# Patient Record
Sex: Female | Born: 1970
Health system: Southern US, Community
[De-identification: ages and names within clinical notes are randomized; demographics above are authoritative.]

## PROBLEM LIST (undated history)

## (undated) DIAGNOSIS — K52832 Lymphocytic colitis: Secondary | ICD-10-CM

## (undated) HISTORY — PX: WISDOM TOOTH EXTRACTION: SHX21

## (undated) HISTORY — PX: TUBAL LIGATION: SHX77

## (undated) HISTORY — PX: NOVASURE ABLATION: SHX5394

## (undated) HISTORY — PX: DILATION AND CURETTAGE OF UTERUS: SHX78

---

## 1998-04-07 ENCOUNTER — Other Ambulatory Visit: Admission: RE | Admit: 1998-04-07 | Discharge: 1998-04-07 | Payer: Self-pay | Admitting: Obstetrics & Gynecology

## 1998-05-07 ENCOUNTER — Other Ambulatory Visit: Admission: RE | Admit: 1998-05-07 | Discharge: 1998-05-07 | Payer: Self-pay | Admitting: Obstetrics & Gynecology

## 1999-04-15 ENCOUNTER — Other Ambulatory Visit: Admission: RE | Admit: 1999-04-15 | Discharge: 1999-04-15 | Payer: Self-pay | Admitting: Obstetrics & Gynecology

## 2000-05-11 ENCOUNTER — Other Ambulatory Visit: Admission: RE | Admit: 2000-05-11 | Discharge: 2000-05-11 | Payer: Self-pay | Admitting: Obstetrics & Gynecology

## 2000-09-11 ENCOUNTER — Ambulatory Visit (HOSPITAL_COMMUNITY): Admission: RE | Admit: 2000-09-11 | Discharge: 2000-09-11 | Payer: Self-pay | Admitting: Obstetrics & Gynecology

## 2000-09-11 ENCOUNTER — Encounter (INDEPENDENT_AMBULATORY_CARE_PROVIDER_SITE_OTHER): Payer: Self-pay | Admitting: Specialist

## 2001-07-12 ENCOUNTER — Other Ambulatory Visit: Admission: RE | Admit: 2001-07-12 | Discharge: 2001-07-12 | Payer: Self-pay | Admitting: Obstetrics & Gynecology

## 2002-04-01 ENCOUNTER — Encounter (INDEPENDENT_AMBULATORY_CARE_PROVIDER_SITE_OTHER): Payer: Self-pay | Admitting: Specialist

## 2002-04-01 ENCOUNTER — Inpatient Hospital Stay (HOSPITAL_COMMUNITY): Admission: AD | Admit: 2002-04-01 | Discharge: 2002-04-03 | Payer: Self-pay | Admitting: Obstetrics and Gynecology

## 2002-08-07 ENCOUNTER — Other Ambulatory Visit: Admission: RE | Admit: 2002-08-07 | Discharge: 2002-08-07 | Payer: Self-pay | Admitting: Obstetrics & Gynecology

## 2003-12-12 ENCOUNTER — Other Ambulatory Visit: Admission: RE | Admit: 2003-12-12 | Discharge: 2003-12-12 | Payer: Self-pay | Admitting: Obstetrics & Gynecology

## 2007-01-05 ENCOUNTER — Ambulatory Visit (HOSPITAL_COMMUNITY): Admission: RE | Admit: 2007-01-05 | Discharge: 2007-01-05 | Payer: Self-pay | Admitting: Family Medicine

## 2010-02-05 ENCOUNTER — Ambulatory Visit (HOSPITAL_COMMUNITY): Admission: RE | Admit: 2010-02-05 | Discharge: 2010-02-05 | Payer: Self-pay | Admitting: Obstetrics and Gynecology

## 2010-10-09 LAB — CBC
MCH: 33.6 pg (ref 26.0–34.0)
MCV: 98.7 fL (ref 78.0–100.0)
Platelets: 172 10*3/uL (ref 150–400)
RBC: 3.94 MIL/uL (ref 3.87–5.11)

## 2010-12-10 NOTE — Discharge Summary (Signed)
   NAME:  Andrea Daugherty, Andrea Daugherty                         ACCOUNT NO.:  [id number]   MEDICAL RECORD NO.:  [id number]                   PATIENT TYPE:  AMB   LOCATION:  SDC                                  FACILITY:  [facility]   PHYSICIAN:  Ilda Mori, M.D.                DATE OF BIRTH:  10/02/1970   DATE OF ADMISSION:  04/01/2002  DATE OF DISCHARGE:  04/03/2002                                 DISCHARGE SUMMARY   FINAL DIAGNOSES:  1. Intrauterine pregnancy at term.  2. History of previous cesarean section x2.  Declines attempt at vaginal     birth after cesarean.  3. The patient desires permanent sterilization.   PROCEDURE:  Repeat low transverse cesarean section and bilateral tubal  ligation.   SURGEON:  Malva Limes, MD   ASSISTANT:  Carrington Clamp, M.D.   COMPLICATIONS:  None.   HISTORY:  This 40 year old G4, P2-0-1-2 presents at term for repeat cesarean  section.  The patient had had a history of cesarean sections with her past  two pregnancies and declines VBAC.  The patient's antepartum course was  complicated by smoking and late prenatal care.  It was otherwise negative.  She was taken to the operating room on April 01, 2002 by Dr. Malva Limes where a repeat low transverse cesarean section was performed with  the delivery of an 8 pound 4 ounce female infant with Apgars of 8 and 9.  Delivery went without complications.  A bilateral tubal ligation was  performed at this time without complications.  The patient's postoperative  course was benign without significant fevers.  She was felt ready for  discharge on postoperative day number two.  She was sent home on a regular  diet.  Told to decrease activities.  Told to continue prenatal vitamins and  FeSo4.  She was sent home on over-the-counter pain medicine.  She declined  offer for Percocet and was to follow up in the office in four weeks.  Hemoglobin on discharge:  The patient had a hemoglobin of 9.4.     Leilani Able, PA-C                   Ilda Mori, M.D.    MB/MEDQ  D:  05/13/2002  T:  05/13/2002  Job:  [id number]

## 2010-12-10 NOTE — Op Note (Signed)
[facility]  Patient:    Andrea Daugherty, Andrea Daugherty                      MRN: [id number] Proc. Date: 09/11/00 Adm. Date:  [id number] Attending:  Mickle Mallory                           Operative Report  PREOPERATIVE DIAGNOSIS:       Inevitable abortion, blood type O positive.  POSTOPERATIVE DIAGNOSIS:      Inevitable abortion, blood type O positive, pathology pending.  PROCEDURE:                    Suction dilatation and curettage.  SURGEON:                      Gerrit Friends. Aldona Bar, M.D.  HISTORY:                      This 40 year old gravida 3, para 2 has been followed in the office for the past week with dropping quantitative hCG levels and vaginal spotting.  An ultrasound done today in the office found a sac consistent with a five week gestation - no fetal pole was noted.  By dates, the patient should be at least 6-7 weeks.  She is taken to the operating room at this time for suction D&C with the diagnosis of an inevitable AB.  DESCRIPTION OF PROCEDURE:     The patient was taken to the operating room where, after the satisfactory induction of intravenous conscious sedation, she was prepped and draped, having been placed in the modified lithotomy position in the short Allen stirrups.  The bladder was drained of clear urine with a red rubber catheter in in-and-out fashion.  After the patient was prepped and draped, a speculum was placed in the vagina and the cervix was grasped with a single tooth tenaculum on the anterior lip.  Paracervical block was then carried out with approximately 20 cc of 1% Xylocaine with epinephrine. Thereafter, using the [facility] dilators, the internal os was dilated to a [employer] without difficulty.  Thereafter, using a #7 suction curet, the cavity was thoroughly, gently and systematically evacuated of all products of conception.  This was followed by the small curet, which revealed no further tissue produced, and resuctioning  likewise produced to further tissue.  At this point, the procedure was terminated.  The patient was transported to the recovery room in satisfactory condition, having tolerated the procedure well. Estimated blood loss was neglibile.  All counts were correct x 2.  Pathologic specimen consistent of products of conception.  DISPOSITION:                  The patient will be discharged to home with a prescription for doxycycline 100 mg b.i.d. for a total of four days and she will be given a prescription for Anaprox DS to use one q.8h. p.r.n. cramping. She will return to the office in approximately one weeks time for follow up or as needed.  A detailed instruction sheet will be given to the patient at the time of discharge. DD:  09/12/00 TD:  09/12/00 Job: [id number]

## 2010-12-10 NOTE — Op Note (Signed)
NAME:  Andrea Daugherty, Andrea Daugherty                         ACCOUNT NO.:  [id number]   MEDICAL RECORD NO.:  [id number]                   PATIENT TYPE:  INP   LOCATION:  9198                                 FACILITY:  [facility]   PHYSICIAN:  Malva Limes, MD                   DATE OF BIRTH:  [birth year]   DATE OF PROCEDURE:  04/01/2002  DATE OF DISCHARGE:                                 OPERATIVE REPORT   PREOPERATIVE DIAGNOSES:  1. Intrauterine pregnancy at term.  2. History of cesarean sections times two.  3. Declines attempt at vaginal delivery after cesarean section.  4. The patient desires permanent sterilization.   POSTOPERATIVE DIAGNOSES:  1. Intrauterine pregnancy at term.  2. History of cesarean section times two.  3. Declines attempt at vaginal delivery after cesarean section.  4. The patient desires permanent sterilization.   PROCEDURE:  1. Repeat low transverse cesarean section.  2. Bilateral tubal ligation.   SURGEON:  Malva Limes, M.D.   ASSISTANT:  Carrington Clamp, M.D.   ANESTHESIA:  Spinal.   ANTIBIOTIC:  Ancef one gram.   ESTIMATED BLOOD LOSS:  900 cc.   DRAINS:  Foley to bedside drainage.   COMPLICATIONS:  None.   SPECIMENS:  None.   FINDINGS:  The patient had normal fallopian tubes and ovaries bilaterally.  Placenta appeared to be normal.  The patient delivered one viable white  female infant weighing 8 pounds 4 ounces.   PROCEDURE:  The patient was taken to the operating room where spinal  anesthetic was administered without complications. She was then placed in  the dorsal supine position with a left lateral tilt.  The patient was  prepped with Hibiclens. A Foley catheter was placed.  She was draped in the  usual fashion for this procedure.  A Pfannenstiel incision was made through  the previous scar and this was carried down to the fascia.  The fascia was  entered in the midline and extended laterally with the Mayo scissors. The  rectus muscles were then  separate from the fascia with Bovie.  The rectus  muscles were divided in the midline and taken down superiorly and  inferiorly.  There was an omental adhesion which was taken down with the  Bovie.  The bladder flap was then taken down sharply.  A low transverse  uterine incision was made in the midline and extended laterally with blunt  dissection.  The amniotic sac was entered sharply and the fluid was noted to  be clear.  The infant was discovered to be in a frank breech presentation.  The infant was delivered by delivering the feet first, then the arms and  then the head.  The oropharynx and nostrils were then bulb suctioned. The  cord was doubled clamped and cut and the infant handed to the waiting NICU  team.  Cord blood was then obtained.  The placenta was then  manually  removed.  The uterus was externalized and the uterine cavity washed with a  wet lap.  The placenta was rather adherent to the uterine wall making this  suspicious for placenta accreta.  Once I was sure that the placenta was  removed the uterine incision was closed using 0 chromic in a running locking  fashion. The bladder flap was closed using 3-0 chromic in a running fashion.  The right fallopian tube was grasped with the Babcock in the isthmic  portion. A 3 cm knuckle was ligated times two with 0 gut and then excised.  The ostia were visualized times two.  Hemostasis was checked and felt to be  adequate.  A similar procedure was performed on the opposite side.  The  uterus was then placed back into the abdominal cavity.  Hemostasis and  sutures were checked and felt to be normal.  The parietal peritoneum and  rectus muscles were reapproximated in the midline using 3-0 chromic suture.  The fascia was closed using 0 Monocryl suture. Clips were used to close the  skin. The patient tolerated the procedure well and she was taken to the  recovery room in stable condition.  Instrument and lap counts were correct  times  two.                                               Malva Limes, MD    MA/MEDQ  D:  04/01/2002  T:  04/01/2002  Job:  [id number]

## 2011-08-06 ENCOUNTER — Emergency Department (HOSPITAL_COMMUNITY): Payer: BC Managed Care – PPO

## 2011-08-06 ENCOUNTER — Emergency Department (HOSPITAL_COMMUNITY)
Admission: EM | Admit: 2011-08-06 | Discharge: 2011-08-06 | Disposition: A | Discharge status: Home / Self Care | Payer: BC Managed Care – PPO | Attending: Emergency Medicine | Admitting: Emergency Medicine

## 2011-08-06 ENCOUNTER — Encounter (HOSPITAL_COMMUNITY): Payer: Self-pay | Admitting: Emergency Medicine

## 2011-08-06 DIAGNOSIS — M1990 Unspecified osteoarthritis, unspecified site: Secondary | ICD-10-CM

## 2011-08-06 DIAGNOSIS — M25519 Pain in unspecified shoulder: Secondary | ICD-10-CM | POA: Insufficient documentation

## 2011-08-06 DIAGNOSIS — M25511 Pain in right shoulder: Secondary | ICD-10-CM

## 2011-08-06 DIAGNOSIS — M19019 Primary osteoarthritis, unspecified shoulder: Secondary | ICD-10-CM | POA: Insufficient documentation

## 2011-08-06 NOTE — ED Notes (Signed)
Patient c/o right shoulder pain. Per patient rotator cuff has been "rubbing for a while but yesterday she felt a pop and the pain has been worse since.

## 2011-08-06 NOTE — ED Notes (Signed)
Pt reports has had problems with r shoulder "grinding" for "a while."  Pt says this am raised arms over head to stretch and felt something pop.  Pt able to move extremity.  Extremity warm to touch, radial pulse present.

## 2011-08-06 NOTE — ED Provider Notes (Signed)
Medical screening examination/treatment/procedure(s) were performed by non-physician practitioner and as supervising physician I was immediately available for consultation/collaboration.  Donnetta Hutching, MD 08/06/11 [phone]

## 2011-08-06 NOTE — ED Provider Notes (Signed)
History     CSN: [id number]  Arrival date & time 08/06/11  0945   First MD Initiated Contact with Patient 08/06/11 1051      Chief Complaint  Patient presents with  . Shoulder Pain    (Consider location/radiation/quality/duration/timing/severity/associated sxs/prior treatment) Patient is a 41 y.o. female presenting with shoulder pain. The history is provided by the patient. No language interpreter was used.  Shoulder Pain This is a chronic problem. Episode onset: popping and grinding for several months.  worsening pain in past 3 weeks. The problem occurs daily. The problem has been unchanged. Exacerbated by: shoulder movement. She has tried nothing for the symptoms. The treatment provided no relief.    History reviewed. No pertinent past medical history.  Past Surgical History  Procedure Date  . Cesarean section   . Dilation and curettage of uterus   . Wisdom tooth extraction   . Novasure ablation   . Tubal ligation     Family History  Problem Relation Age of Onset  . Cancer Mother   . Heart failure Father     History  Substance Use Topics  . Smoking status: Current Everyday Smoker -- 1.0 packs/day for 28 years    Types: Cigarettes  . Smokeless tobacco: Never Used  . Alcohol Use: No    OB History    Grav Para Term Preterm Abortions TAB SAB Ect Mult Living   4 3 3  1  1   3       Review of Systems  Musculoskeletal:       Shoulder pain  All other systems reviewed and are negative.    Allergies  Hydrocodone  Home Medications  No current outpatient prescriptions on file.  BP 113/79  Pulse 68  Temp(Src) 98.3 F (36.8 C) (Oral)  Resp 16  Ht 5\' 1"  (1.549 m)  Wt 122 lb (55.339 kg)  BMI 23.05 kg/m2  SpO2 99%  LMP 08/02/2011  Physical Exam  Nursing note and vitals reviewed. Constitutional: She is oriented to person, place, and time. She appears well-developed and well-nourished. No distress.  HENT:  Head: Normocephalic and atraumatic.  Eyes: EOM  are normal.  Neck: Normal range of motion.  Cardiovascular: Normal rate, regular rhythm and normal heart sounds.   Pulmonary/Chest: Effort normal and breath sounds normal.  Abdominal: Soft. She exhibits no distension. There is no tenderness.  Musculoskeletal: Normal range of motion. She exhibits tenderness.       Right shoulder: She exhibits tenderness, bony tenderness, crepitus and pain. She exhibits normal range of motion, no swelling, no effusion, no deformity, no laceration, no spasm, normal pulse and normal strength.       Arms: Neurological: She is alert and oriented to person, place, and time.  Skin: Skin is warm and dry.  Psychiatric: She has a normal mood and affect. Judgment normal.    ED Course  Procedures (including critical care time)  Labs Reviewed - No data to display No results found.   No diagnosis found.    MDM          Worthy Rancher, PA 08/06/11 1141

## 2011-08-16 ENCOUNTER — Encounter: Payer: Self-pay | Admitting: Orthopedic Surgery

## 2011-08-16 ENCOUNTER — Ambulatory Visit (INDEPENDENT_AMBULATORY_CARE_PROVIDER_SITE_OTHER): Payer: BC Managed Care – PPO | Admitting: Orthopedic Surgery

## 2011-08-16 VITALS — BP 100/70 | Ht 61.0 in | Wt 122.0 lb

## 2011-08-16 DIAGNOSIS — M75101 Unspecified rotator cuff tear or rupture of right shoulder, not specified as traumatic: Secondary | ICD-10-CM

## 2011-08-16 DIAGNOSIS — M25519 Pain in unspecified shoulder: Secondary | ICD-10-CM

## 2011-08-16 DIAGNOSIS — M67919 Unspecified disorder of synovium and tendon, unspecified shoulder: Secondary | ICD-10-CM

## 2011-08-16 NOTE — Patient Instructions (Addendum)
You have received a steroid shot. 15% of patients experience increased pain at the injection site with in the next 24 hours. This is best treated with ice and tylenol extra strength 2 tabs every 8 hours. If you are still having pain please call the office.    Bursitis shoulder

## 2011-08-17 ENCOUNTER — Encounter: Payer: Self-pay | Admitting: Orthopedic Surgery

## 2011-08-17 DIAGNOSIS — M25519 Pain in unspecified shoulder: Secondary | ICD-10-CM | POA: Insufficient documentation

## 2011-08-17 DIAGNOSIS — M75101 Unspecified rotator cuff tear or rupture of right shoulder, not specified as traumatic: Secondary | ICD-10-CM | POA: Insufficient documentation

## 2011-08-17 NOTE — Progress Notes (Signed)
Patient ID: Andrea Daugherty, female   DOB: 1971-06-27, 41 y.o.   MRN: [id number] RIGHT shoulder pain started August 05, 2011  41 year old right-hand-dominant female presents with sudden onset of pain in the RIGHT shoulder.  She was at work her shoulder pop since that time she's had throbbing for the 10 intermittent pain associated with some tingling in the RIGHT upper extremity and some difficulty with range of motion.  She has taken some ibuprofen seems to help only minimally.  Review of Systems  Respiratory: Positive for cough.   Neurological: Positive for tingling.  Endo/Heme/Allergies: Positive for environmental allergies.  All other systems reviewed and are negative.  Physical Exam(12) GENERAL: normal development   CDV: pulses are normal   Skin: normal  Lymph: nodes were not palpable/normal  Psychiatric: awake, alert and oriented  Neuro: normal sensation  MSK Ambulation is normal, cervical spine range of motion is normal, no tenderness. 1 RIGHT shoulder full range of motion with crepitance especially internal and external rotation of the RIGHT shoulder.  It is painful forward elevation past 120.  Internal and external rotation strength remains normal supraspinatus strength remains normal.  Apprehension test is normal.  Impingement sign is positive.  Assessment: X-ray was done at the hospital it shows a normal shoulder with mild a.c. Joint arthrosis.  Impression rotator cuff syndrome RIGHT shoulder  Shoulder Injection Procedure Note   Pre-operative Diagnosis: right  RC Syndrome  Post-operative Diagnosis: same  Indications: pain   Anesthesia: ethyl chloride   Procedure Details   Verbal consent was obtained for the procedure. The shoulder was prepped withalcohol and the skin was anesthetized. A 20 gauge needle was advanced into the subacromial space through posterior approach without difficulty  The space was then injected with 3 ml 1% lidocaine and 1 ml of depomedrol.  The injection site was cleansed with isopropyl alcohol and a dressing was applied.  Complications:  None; patient tolerated the procedure well.      Plan: Inject RIGHT shoulder, take ibuprofen 800 mg 3 times a day.  Take Vicodin every 6 hours p.r.n. Pain #30.  Followup as needed

## 2012-02-27 ENCOUNTER — Other Ambulatory Visit: Payer: Self-pay | Admitting: Obstetrics & Gynecology

## 2013-07-11 ENCOUNTER — Ambulatory Visit (INDEPENDENT_AMBULATORY_CARE_PROVIDER_SITE_OTHER): Payer: BC Managed Care – PPO

## 2013-07-11 ENCOUNTER — Ambulatory Visit (INDEPENDENT_AMBULATORY_CARE_PROVIDER_SITE_OTHER): Payer: BC Managed Care – PPO | Admitting: Orthopedic Surgery

## 2013-07-11 VITALS — BP 117/79 | Ht 61.5 in | Wt 144.0 lb

## 2013-07-11 DIAGNOSIS — M25521 Pain in right elbow: Secondary | ICD-10-CM

## 2013-07-11 DIAGNOSIS — M7711 Lateral epicondylitis, right elbow: Secondary | ICD-10-CM

## 2013-07-11 DIAGNOSIS — M25529 Pain in unspecified elbow: Secondary | ICD-10-CM

## 2013-07-11 DIAGNOSIS — M7710 Lateral epicondylitis, unspecified elbow: Secondary | ICD-10-CM | POA: Insufficient documentation

## 2013-07-11 NOTE — Progress Notes (Signed)
Patient ID: Andrea Daugherty, female   DOB: [birth year], 42 y.o.   MRN: [id number]  NEW PROBLEM   PAIN RIGHT ELBOW    42 year old female previously seen for bursitis right shoulder presents with sudden onset of right elbow pain along the lateral epicondyles. Present for 3 weeks. Sharp pain is noted with 4/10 intensity, tends to come and go worse with lifting better with not using her arm mild swelling  She is a right-hand-dominant farmer  Currently review of systems positive findings her cough heartburn diarrhea joint pain and swelling elbow muscle pain elbow seasonal allergies  Hydrocodone gives her a rash surgery includes cesarean section x31 D&C wisdom teeth extraction  Current medications Advil for right shoulder area family history normal  Social history married one pack per day smoker no alcohol   habitus Vital signs are stable as recorded  General appearance is normal, body habitus Ectomorphic body   The patient is alert and oriented x 3  The patient's mood and affect are normal  Gait assessment: normal and noncontributory   The cardiovascular exam reveals normal pulses and temperature without edema or  swelling.  The lymphatic system is negative for palpable lymph nodes  The sensory exam is normal.  There are no pathologic reflexes.  Balance is normal.   Exam of the right elbow  Inspection lateral tenderness over the epicondyles and slightly anterior and distal Range of motion normal Stability normal Strength grade 5 motor strength  Skin normal, no rash, or laceration. Provocative tests positive  X-rays are normal AP and lateral x-ray was taken  Lateral epicondylitis  Injection Tennis elbow brace Ice Advil  Lateral epicondyle injection. (right)  Consent.  Timeout to confirm site.  LEFT elbow was injected with Depo-Medrol 40 mg and lidocaine 1% 3 cc with sterile technique using alcohol and ethyl chloride prep.  There were no complications

## 2013-07-11 NOTE — Patient Instructions (Signed)
You have received a steroid shot. 15% of patients experience increased pain at the injection site with in the next 24 hours. This is best treated with ice and tylenol extra strength 2 tabs every 8 hours. If you are still having pain please call the office.    

## 2013-07-22 ENCOUNTER — Other Ambulatory Visit: Payer: Self-pay | Admitting: Obstetrics & Gynecology

## 2013-11-08 ENCOUNTER — Other Ambulatory Visit: Payer: Self-pay | Admitting: Gastroenterology

## 2014-05-26 ENCOUNTER — Encounter: Payer: Self-pay | Admitting: Orthopedic Surgery

## 2014-07-28 ENCOUNTER — Other Ambulatory Visit: Payer: Self-pay | Admitting: Obstetrics & Gynecology

## 2014-07-29 LAB — CYTOLOGY - PAP

## 2015-05-21 ENCOUNTER — Ambulatory Visit (INDEPENDENT_AMBULATORY_CARE_PROVIDER_SITE_OTHER): Payer: BLUE CROSS/BLUE SHIELD | Admitting: Orthopedic Surgery

## 2015-05-21 ENCOUNTER — Encounter: Payer: Self-pay | Admitting: Orthopedic Surgery

## 2015-05-21 ENCOUNTER — Ambulatory Visit (INDEPENDENT_AMBULATORY_CARE_PROVIDER_SITE_OTHER): Payer: BLUE CROSS/BLUE SHIELD

## 2015-05-21 VITALS — BP 115/71 | Ht 61.5 in | Wt 155.0 lb

## 2015-05-21 DIAGNOSIS — M7541 Impingement syndrome of right shoulder: Secondary | ICD-10-CM | POA: Diagnosis not present

## 2015-05-21 DIAGNOSIS — M25511 Pain in right shoulder: Secondary | ICD-10-CM | POA: Diagnosis not present

## 2015-05-21 MED ORDER — HYDROCODONE-ACETAMINOPHEN 5-325 MG PO TABS: 1.0000 | ORAL_TABLET | Freq: Four times a day (QID) | ORAL | Status: DC | PRN

## 2015-05-21 NOTE — Patient Instructions (Signed)
Joint Injection  Care After  Refer to this sheet in the next few days. These instructions provide you with information on caring for yourself after you have had a joint injection. Your caregiver also may give you more specific instructions. Your treatment has been planned according to current medical practices, but problems sometimes occur. Call your caregiver if you have any problems or questions after your procedure.  After any type of joint injection, it is not uncommon to experience:  · Soreness, swelling, or bruising around the injection site.  · Mild numbness, tingling, or weakness around the injection site caused by the numbing medicine used before or with the injection.  It also is possible to experience the following effects associated with the specific agent after injection:  · Iodine-based contrast agents:  ¨ Allergic reaction (itching, hives, widespread redness, and swelling beyond the injection site).  · Corticosteroids (These effects are rare.):  ¨ Allergic reaction.  ¨ Increased blood sugar levels (If you have diabetes and you notice that your blood sugar levels have increased, notify your caregiver).  ¨ Increased blood pressure levels.  ¨ Mood swings.  · Hyaluronic acid in the use of viscosupplementation.  ¨ Temporary heat or redness.  ¨ Temporary rash and itching.  ¨ Increased fluid accumulation in the injected joint.  These effects all should resolve within a day after your procedure.   HOME CARE INSTRUCTIONS  · Limit yourself to light activity the day of your procedure. Avoid lifting heavy objects, bending, stooping, or twisting.  · Take prescription or over-the-counter pain medication as directed by your caregiver.  · You may apply ice to your injection site to reduce pain and swelling the day of your procedure. Ice may be applied 03-04 times:  ¨ Put ice in a plastic bag.  ¨ Place a towel between your skin and the bag.  ¨ Leave the ice on for no longer than 15-20 minutes each time.  SEEK  IMMEDIATE MEDICAL CARE IF:   · Pain and swelling get worse rather than better or extend beyond the injection site.  · Numbness does not go away.  · Blood or fluid continues to leak from the injection site.  · You have chest pain.  · You have swelling of your face or tongue.  · You have trouble breathing or you become dizzy.  · You develop a fever, chills, or severe tenderness at the injection site that last longer than 1 day.  MAKE SURE YOU:  · Understand these instructions.  · Watch your condition.  · Get help right away if you are not doing well or if you get worse.  Document Released: 03/24/2011 Document Revised: 10/03/2011 Document Reviewed: 03/24/2011  [employer]® Patient Information ©2015 [employer]. This information is not intended to replace advice given to you by your health care provider. Make sure you discuss any questions you have with your health care provider.

## 2015-05-21 NOTE — Progress Notes (Signed)
Patient ID: Andrea Daugherty, female   DOB: 08/09/1970, 44 y.o.   MRN: [id number]  Chief Complaint  Patient presents with  . Shoulder Pain    recurring right shoulder pain, last treated 2013     Andrea RueSharon C Cleaves is a 44 y.o. , femalewho now presents with right shoulder pain  Presents back with recurrent right shoulder pain previously treated with injection, ibuprofen and Norco 5 mg. She had impingement syndrome x-rays were negative back in 2013  She presents after an ER visit where she had no injury but she's been doing some work on the trailer of her daughter and now complains of dull aching anterior shoulder pain moderate to severe several weeks duration associated with forward elevation and overactivity  Review of Systems Review of Systems She denies fever chills, other joint pain other than the right knee, skin rash, warmth of the joint     Past Surgical History  Procedure Laterality Date  . Cesarean section    . Dilation and curettage of uterus    . Wisdom tooth extraction    . Novasure ablation    . Tubal ligation      Family History  Problem Relation Age of Onset  . Cancer Mother   . Heart failure Father     Social History Social History  Substance Use Topics  . Smoking status: Current Every Day Smoker -- 1.00 packs/day for 28 years    Types: Cigarettes  . Smokeless tobacco: Never Used  . Alcohol Use: No    Allergies  Allergen Reactions  . Hydrocodone Rash    Current Outpatient Prescriptions  Medication Sig Dispense Refill  . Ibuprofen 200 MG CAPS Take by mouth.    Marland Kitchen. HYDROcodone-acetaminophen (NORCO/VICODIN) 5-325 MG tablet Take 1 tablet by mouth every 6 (six) hours as needed for moderate pain. 30 tablet 0   No current facility-administered medications for this visit.       Physical Exam Blood pressure 115/71, height 5' 1.5" (1.562 m), weight 155 lb (70.308 kg), last menstrual period 05/07/2015. Physical Exam  Objective:     The patient is awake  alert and oriented 3 her mood and affect are normal. She exhibits normal grooming and hygiene without gross deformity.  Gait is normal the noncontributory  On inspection of the right shoulder we find tenderness in the rotator interval. The patient exhibits decreased range of motion with forward elevation in the scapular plane. We also find a loss of internal rotation  The patient is stable in abduction external rotation  The internal and external rotators have normal strength we find mild weakness in the right rotator cuff supraspinatus tendon with the empty can sign  The Neer sign for impingement was positive  The skin is warm dry and intact without erythema laceration or previous incision.  Sensation remains normal and the patient has a normal pulse with good perfusion and a warm extremity to touch  Cervical spine is nontender with full range of motion  Comparison left shoulder examination reveals a normal range of motion normal strength no instability and no swelling    Data Reviewed My interpretation of the xrays: Normal right shoulder films  Assessment Tendinitis with impingement syndrome right shoulder   Plan Injection Ibuprofen Norco   Procedure note the subacromial injection shoulder RIGHT  Verbal consent was obtained to inject the  RIGHT   Shoulder  Timeout was completed to confirm the injection site is a subacromial space of the  RIGHT  shoulder  Medication used Depo-Medrol 40 mg and lidocaine 1% 3 cc  Anesthesia was provided by ethyl chloride  The injection was performed in the RIGHT  posterior subacromial space. After pinning the skin with alcohol and anesthetized the skin with ethyl chloride the subacromial space was injected using a 20-gauge needle. There were no complications  Sterile dressing was applied.   Meds ordered this encounter  Medications  . HYDROcodone-acetaminophen (NORCO/VICODIN) 5-325 MG tablet    Sig: Take 1 tablet by mouth every 6  (six) hours as needed for moderate pain.    Dispense:  30 tablet    Refill:  0   Ibuprofen 800 mg 3 times a day

## 2016-05-13 DIAGNOSIS — H10413 Chronic giant papillary conjunctivitis, bilateral: Secondary | ICD-10-CM | POA: Diagnosis not present

## 2016-05-16 DIAGNOSIS — Z1389 Encounter for screening for other disorder: Secondary | ICD-10-CM | POA: Diagnosis not present

## 2016-05-16 DIAGNOSIS — J069 Acute upper respiratory infection, unspecified: Secondary | ICD-10-CM | POA: Diagnosis not present

## 2016-05-16 DIAGNOSIS — Z6826 Body mass index (BMI) 26.0-26.9, adult: Secondary | ICD-10-CM | POA: Diagnosis not present

## 2016-05-16 DIAGNOSIS — J209 Acute bronchitis, unspecified: Secondary | ICD-10-CM | POA: Diagnosis not present

## 2016-11-28 ENCOUNTER — Other Ambulatory Visit: Payer: Self-pay | Admitting: Obstetrics & Gynecology

## 2016-11-28 DIAGNOSIS — Z0000 Encounter for general adult medical examination without abnormal findings: Secondary | ICD-10-CM | POA: Diagnosis not present

## 2016-11-28 DIAGNOSIS — Z6824 Body mass index (BMI) 24.0-24.9, adult: Secondary | ICD-10-CM | POA: Diagnosis not present

## 2016-11-28 DIAGNOSIS — Z01419 Encounter for gynecological examination (general) (routine) without abnormal findings: Secondary | ICD-10-CM | POA: Diagnosis not present

## 2016-11-28 DIAGNOSIS — Z124 Encounter for screening for malignant neoplasm of cervix: Secondary | ICD-10-CM | POA: Diagnosis not present

## 2016-11-28 DIAGNOSIS — Z1231 Encounter for screening mammogram for malignant neoplasm of breast: Secondary | ICD-10-CM | POA: Diagnosis not present

## 2016-11-29 LAB — CYTOLOGY - PAP

## 2017-09-27 ENCOUNTER — Ambulatory Visit (HOSPITAL_COMMUNITY)
Admission: RE | Admit: 2017-09-27 | Discharge: 2017-09-27 | Disposition: A | Discharge status: Home / Self Care | Payer: BLUE CROSS/BLUE SHIELD | Source: Ambulatory Visit | Attending: Family Medicine | Admitting: Family Medicine

## 2017-09-27 ENCOUNTER — Other Ambulatory Visit (HOSPITAL_COMMUNITY): Payer: Self-pay | Admitting: Family Medicine

## 2017-09-27 DIAGNOSIS — J029 Acute pharyngitis, unspecified: Secondary | ICD-10-CM | POA: Diagnosis not present

## 2017-09-27 DIAGNOSIS — R1031 Right lower quadrant pain: Secondary | ICD-10-CM

## 2017-09-27 DIAGNOSIS — J111 Influenza due to unidentified influenza virus with other respiratory manifestations: Secondary | ICD-10-CM | POA: Diagnosis not present

## 2017-09-27 DIAGNOSIS — Z1389 Encounter for screening for other disorder: Secondary | ICD-10-CM | POA: Diagnosis not present

## 2017-09-27 DIAGNOSIS — E663 Overweight: Secondary | ICD-10-CM | POA: Diagnosis not present

## 2017-09-27 DIAGNOSIS — Z6825 Body mass index (BMI) 25.0-25.9, adult: Secondary | ICD-10-CM | POA: Diagnosis not present

## 2017-09-27 DIAGNOSIS — R630 Anorexia: Secondary | ICD-10-CM | POA: Diagnosis not present

## 2018-09-19 DIAGNOSIS — Z23 Encounter for immunization: Secondary | ICD-10-CM | POA: Diagnosis not present

## 2018-09-19 DIAGNOSIS — L089 Local infection of the skin and subcutaneous tissue, unspecified: Secondary | ICD-10-CM | POA: Diagnosis not present

## 2018-09-19 DIAGNOSIS — Z6824 Body mass index (BMI) 24.0-24.9, adult: Secondary | ICD-10-CM | POA: Diagnosis not present

## 2018-09-19 DIAGNOSIS — L309 Dermatitis, unspecified: Secondary | ICD-10-CM | POA: Diagnosis not present

## 2018-09-19 DIAGNOSIS — Z1389 Encounter for screening for other disorder: Secondary | ICD-10-CM | POA: Diagnosis not present

## 2018-09-26 DIAGNOSIS — Z6824 Body mass index (BMI) 24.0-24.9, adult: Secondary | ICD-10-CM | POA: Diagnosis not present

## 2018-09-26 DIAGNOSIS — L309 Dermatitis, unspecified: Secondary | ICD-10-CM | POA: Diagnosis not present

## 2018-09-26 DIAGNOSIS — L089 Local infection of the skin and subcutaneous tissue, unspecified: Secondary | ICD-10-CM | POA: Diagnosis not present

## 2018-09-26 DIAGNOSIS — Z1389 Encounter for screening for other disorder: Secondary | ICD-10-CM | POA: Diagnosis not present

## 2018-09-26 DIAGNOSIS — L989 Disorder of the skin and subcutaneous tissue, unspecified: Secondary | ICD-10-CM | POA: Diagnosis not present

## 2019-06-26 ENCOUNTER — Other Ambulatory Visit: Payer: Self-pay

## 2019-06-26 DIAGNOSIS — Z20822 Contact with and (suspected) exposure to covid-19: Secondary | ICD-10-CM

## 2019-06-28 LAB — NOVEL CORONAVIRUS, NAA: SARS-CoV-2, NAA: NOT DETECTED

## 2019-07-24 ENCOUNTER — Other Ambulatory Visit: Payer: Self-pay

## 2019-07-24 ENCOUNTER — Ambulatory Visit: Payer: BC Managed Care – PPO | Attending: Internal Medicine

## 2019-07-24 DIAGNOSIS — Z20828 Contact with and (suspected) exposure to other viral communicable diseases: Secondary | ICD-10-CM | POA: Diagnosis not present

## 2019-07-24 DIAGNOSIS — Z20822 Contact with and (suspected) exposure to covid-19: Secondary | ICD-10-CM

## 2019-07-25 LAB — NOVEL CORONAVIRUS, NAA: SARS-CoV-2, NAA: NOT DETECTED

## 2019-08-19 ENCOUNTER — Ambulatory Visit: Payer: BC Managed Care – PPO | Attending: Internal Medicine

## 2019-08-19 ENCOUNTER — Other Ambulatory Visit: Payer: Self-pay

## 2019-08-19 DIAGNOSIS — Z20822 Contact with and (suspected) exposure to covid-19: Secondary | ICD-10-CM | POA: Diagnosis not present

## 2019-08-20 DIAGNOSIS — J22 Unspecified acute lower respiratory infection: Secondary | ICD-10-CM | POA: Diagnosis not present

## 2019-08-20 DIAGNOSIS — Z681 Body mass index (BMI) 19 or less, adult: Secondary | ICD-10-CM | POA: Diagnosis not present

## 2019-08-20 DIAGNOSIS — J209 Acute bronchitis, unspecified: Secondary | ICD-10-CM | POA: Diagnosis not present

## 2019-08-20 LAB — NOVEL CORONAVIRUS, NAA: SARS-CoV-2, NAA: NOT DETECTED

## 2020-03-05 ENCOUNTER — Other Ambulatory Visit: Payer: BC Managed Care – PPO

## 2020-03-05 ENCOUNTER — Other Ambulatory Visit: Payer: Self-pay | Admitting: Radiology

## 2020-03-05 DIAGNOSIS — Z20822 Contact with and (suspected) exposure to covid-19: Secondary | ICD-10-CM

## 2020-03-06 LAB — NOVEL CORONAVIRUS, NAA: SARS-CoV-2, NAA: NOT DETECTED

## 2020-03-06 LAB — SARS-COV-2, NAA 2 DAY TAT

## 2020-06-11 ENCOUNTER — Ambulatory Visit
Admission: EM | Admit: 2020-06-11 | Discharge: 2020-06-11 | Disposition: A | Discharge status: Home / Self Care | Payer: 59 | Attending: Emergency Medicine | Admitting: Emergency Medicine

## 2020-06-11 ENCOUNTER — Other Ambulatory Visit: Payer: Self-pay

## 2020-06-11 DIAGNOSIS — R059 Cough, unspecified: Secondary | ICD-10-CM | POA: Diagnosis not present

## 2020-06-11 DIAGNOSIS — J441 Chronic obstructive pulmonary disease with (acute) exacerbation: Secondary | ICD-10-CM

## 2020-06-11 DIAGNOSIS — Z1152 Encounter for screening for COVID-19: Secondary | ICD-10-CM

## 2020-06-11 MED ORDER — DEXAMETHASONE SODIUM PHOSPHATE 10 MG/ML IJ SOLN
10.0000 mg | Freq: Once | INTRAMUSCULAR | Status: AC
  Administered 2020-06-11: 10 mg via INTRAMUSCULAR

## 2020-06-11 MED ORDER — PREDNISONE 10 MG (21) PO TBPK: ORAL_TABLET | Freq: Every day | ORAL | 0 refills | Status: DC

## 2020-06-11 MED ORDER — BENZONATATE 100 MG PO CAPS
100.0000 mg | ORAL_CAPSULE | Freq: Three times a day (TID) | ORAL | 0 refills | Status: DC
Start: 2020-06-11 — End: 2020-06-15

## 2020-06-11 MED ORDER — AZITHROMYCIN 250 MG PO TABS: 250.0000 mg | ORAL_TABLET | Freq: Every day | ORAL | 0 refills | Status: DC

## 2020-06-11 NOTE — ED Provider Notes (Signed)
[facility]   [id number] 06/11/20 Arrival Time: 0813   CC: Cough  SUBJECTIVE: History from: patient.  Andrea Daugherty is a 49 y.o. female who presents with productive cough and wheezing x 1 day.  Denies sick exposure to COVID, flu or strep.  Has tried OTC medications without relief.  Symptoms are made worse at night.  Reports previous symptoms in the past with bronchitis.  Report smoking history.   Denies fever, chills, sinus pain, rhinorrhea, sore throat, SOB, chest pain, nausea, changes in bowel or bladder habits.    ROS: As per HPI.  All other pertinent ROS negative.     History reviewed. No pertinent past medical history. Past Surgical History:  Procedure Laterality Date  . CESAREAN SECTION    . DILATION AND CURETTAGE OF UTERUS    . NOVASURE ABLATION    . TUBAL LIGATION    . WISDOM TOOTH EXTRACTION     Allergies  Allergen Reactions  . Hydrocodone Rash   No current facility-administered medications on file prior to encounter.   Current Outpatient Medications on File Prior to Encounter  Medication Sig Dispense Refill  . Ibuprofen 200 MG CAPS Take by mouth.     Social History   Socioeconomic History  . Marital status: Married    Spouse name: Not on file  . Number of children: Not on file  . Years of education: 102  . Highest education level: Not on file  Occupational History    Employer: [employer]  Tobacco Use  . Smoking status: Current Every Day Smoker    Packs/day: 1.00    Years: 28.00    Pack years: 28.00    Types: Cigarettes  . Smokeless tobacco: Never Used  Substance and Sexual Activity  . Alcohol use: No  . Drug use: No  . Sexual activity: Yes    Birth control/protection: Surgical  Other Topics Concern  . Not on file  Social History Narrative  . Not on file   Social Determinants of Health   Financial Resource Strain:   . Difficulty of Paying Living Expenses: Not on file  Food Insecurity:   . Worried About Brewing technologist in the Last Year: Not on file  . Ran Out of Food in the Last Year: Not on file  Transportation Needs:   . Lack of Transportation (Medical): Not on file  . Lack of Transportation (Non-Medical): Not on file  Physical Activity:   . Days of Exercise per Week: Not on file  . Minutes of Exercise per Session: Not on file  Stress:   . Feeling of Stress : Not on file  Social Connections:   . Frequency of Communication with Friends and Family: Not on file  . Frequency of Social Gatherings with Friends and Family: Not on file  . Attends Religious Services: Not on file  . Active Member of Clubs or Organizations: Not on file  . Attends Banker Meetings: Not on file  . Marital Status: Not on file  Intimate Partner Violence:   . Fear of Current or Ex-Partner: Not on file  . Emotionally Abused: Not on file  . Physically Abused: Not on file  . Sexually Abused: Not on file   Family History  Problem Relation Age of Onset  . Cancer Mother   . Heart failure Father     OBJECTIVE:  Vitals:   06/11/20 0832  BP: 137/90  Pulse: 80  Resp: 20  Temp: 98.7 F (37.1  C)  SpO2: 96%     General appearance: alert; appears fatigued, but nontoxic; speaking in full sentences and tolerating own secretions HEENT: NCAT; Ears: EACs clear, TMs pearly gray; Eyes: PERRL.  EOM grossly intact. Nose: nares patent without rhinorrhea, Throat: oropharynx clear, tonsils non erythematous or enlarged, uvula midline  Neck: supple without LAD Lungs: unlabored respirations, symmetrical air entry; cough: mild; no respiratory distress; mild subtle wheezes throughout Heart: regular rate and rhythm.   Skin: warm and dry Psychological: alert and cooperative; normal mood and affect  ASSESSMENT & PLAN:  1. Encounter for screening for COVID-19   2. Cough     Meds ordered this encounter  Medications  . benzonatate (TESSALON) 100 MG capsule    Sig: Take 1 capsule (100 mg total) by mouth every 8 (eight)  hours.    Dispense:  21 capsule    Refill:  0    Order Specific Question:   Supervising Provider    Answer:   Eustace Moore [[id number]]  . azithromycin (ZITHROMAX) 250 MG tablet    Sig: Take 1 tablet (250 mg total) by mouth daily. Take first 2 tablets together, then 1 every day until finished.    Dispense:  6 tablet    Refill:  0    Order Specific Question:   Supervising Provider    Answer:   Eustace Moore [[id number]]  . predniSONE (STERAPRED UNI-PAK 21 TAB) 10 MG (21) TBPK tablet    Sig: Take by mouth daily. Take 6 tabs by mouth daily  for 2 days, then 5 tabs for 2 days, then 4 tabs for 2 days, then 3 tabs for 2 days, 2 tabs for 2 days, then 1 tab by mouth daily for 2 days    Dispense:  42 tablet    Refill:  0    Order Specific Question:   Supervising Provider    Answer:   Eustace Moore [[id number]]  . dexamethasone (DECADRON) injection 10 mg    COVID testing ordered.  It will take between 5-7 days for test results.  Someone will contact you regarding abnormal results.    In the meantime: You should remain isolated in your home for 10 days from symptom onset AND greater than 72 hours after symptoms resolution (absence of fever without the use of fever-reducing medication and improvement in respiratory symptoms), whichever is longer Get plenty of rest and push fluids Tessalon Perles prescribed for cough Use OTC zyrtec for nasal congestion, runny nose, and/or sore throat Use OTC flonase for nasal congestion and runny nose Use medications daily for symptom relief Use OTC medications like ibuprofen or tylenol as needed fever or pain Call or go to the ED if you have any new or worsening symptoms such as fever, worsening cough, shortness of breath, chest tightness, chest pain, turning blue, changes in mental status, etc...   Patient hx significant for smoking and on exam subtle wheezes heard throughout.  Patient also reporting productive cough.  Will cover for COPD exacerbation.   Prednisone and z-pak prescribed.  Patient also requests steroid shot.    Reviewed expectations re: course of current medical issues. Questions answered. Outlined signs and symptoms indicating need for more acute intervention. Patient verbalized understanding. After Visit Summary given.         Rennis Harding, PA-C 06/11/20 [id number]

## 2020-06-11 NOTE — Discharge Instructions (Signed)

## 2020-06-11 NOTE — ED Triage Notes (Signed)
Pt presents with cough that began yesterday

## 2020-06-12 LAB — NOVEL CORONAVIRUS, NAA: SARS-CoV-2, NAA: NOT DETECTED

## 2020-06-12 LAB — SARS-COV-2, NAA 2 DAY TAT

## 2020-06-15 ENCOUNTER — Other Ambulatory Visit: Payer: Self-pay

## 2020-06-15 ENCOUNTER — Ambulatory Visit
Admission: EM | Admit: 2020-06-15 | Discharge: 2020-06-15 | Disposition: A | Discharge status: Home / Self Care | Payer: 59 | Attending: Emergency Medicine | Admitting: Emergency Medicine

## 2020-06-15 DIAGNOSIS — R059 Cough, unspecified: Secondary | ICD-10-CM

## 2020-06-15 DIAGNOSIS — R062 Wheezing: Secondary | ICD-10-CM

## 2020-06-15 MED ORDER — HYDROCOD POLST-CPM POLST ER 10-8 MG/5ML PO SUER: 5.0000 mL | Freq: Two times a day (BID) | ORAL | 0 refills | Status: DC | PRN

## 2020-06-15 MED ORDER — METHYLPREDNISOLONE SODIUM SUCC 125 MG IJ SOLR
125.0000 mg | Freq: Once | INTRAMUSCULAR | Status: AC
  Administered 2020-06-15: 125 mg via INTRAMUSCULAR

## 2020-06-15 MED ORDER — ALBUTEROL SULFATE HFA 108 (90 BASE) MCG/ACT IN AERS
2.0000 | INHALATION_SPRAY | Freq: Once | RESPIRATORY_TRACT | Status: AC
  Administered 2020-06-15: 2 via RESPIRATORY_TRACT

## 2020-06-15 NOTE — Discharge Instructions (Signed)
Due to worsening symptoms offered patient further evaluation and management in the ED.  Unable to do breathing treatment in urgent care setting.  Unable to rule out blood clot in urgent care setting.  Offered patient further evaluation and management in the ED.  Patient declines at this time and would like to try outpatient therapy first.  Aware of the risk associated with this decision including missed diagnosis, organ damage, organ failure, and/or death.  Patient aware and in agreement.     Get plenty of rest and push fluids Solu-medrol injection given in office Albuterol inhaler given in office tussinex cough syrup given Continue with prednisone taper Follow up with PCP for recheck Return or go to ER if you have any new or worsening symptoms such as fever, chills, fatigue, shortness of breath, wheezing, chest pain, nausea, changes in bowel or bladder habits, etc..Marland Kitchen

## 2020-06-15 NOTE — ED Provider Notes (Signed)
[facility]   [id number] 06/15/20 Arrival Time: 1143  Cc: COUGH  SUBJECTIVE:  Andrea Daugherty is a 49 y.o. female who presents with persistent dry cough x 5 days.  Denies sick exposure or precipitating event.  Describes cough as constant and dry.  Was seen here on 06/11/20 and given steroid shot, prednisone taper, and antibiotic with minimal relief.  Symptoms are made worse with deep breath and activity.  Denies previous symptoms in the past. Complains of associated throat irritation.  Denies fever, chills, SOB, wheezing, chest pain, nausea, changes in bowel or bladder habits.    ROS: As per HPI.  All other pertinent ROS negative.     History reviewed. No pertinent past medical history. Past Surgical History:  Procedure Laterality Date  . CESAREAN SECTION    . DILATION AND CURETTAGE OF UTERUS    . NOVASURE ABLATION    . TUBAL LIGATION    . WISDOM TOOTH EXTRACTION     Allergies  Allergen Reactions  . Hydrocodone Rash   No current facility-administered medications on file prior to encounter.   Current Outpatient Medications on File Prior to Encounter  Medication Sig Dispense Refill  . Ibuprofen 200 MG CAPS Take by mouth.    . predniSONE (STERAPRED UNI-PAK 21 TAB) 10 MG (21) TBPK tablet Take by mouth daily. Take 6 tabs by mouth daily  for 2 days, then 5 tabs for 2 days, then 4 tabs for 2 days, then 3 tabs for 2 days, 2 tabs for 2 days, then 1 tab by mouth daily for 2 days 42 tablet 0    Social History   Socioeconomic History  . Marital status: Married    Spouse name: Not on file  . Number of children: Not on file  . Years of education: 50  . Highest education level: Not on file  Occupational History    Employer: [employer]  Tobacco Use  . Smoking status: Current Every Day Smoker    Packs/day: 1.00    Years: 28.00    Pack years: 28.00    Types: Cigarettes  . Smokeless tobacco: Never Used  Substance and Sexual Activity  . Alcohol use: No  . Drug  use: No  . Sexual activity: Yes    Birth control/protection: Surgical  Other Topics Concern  . Not on file  Social History Narrative  . Not on file   Social Determinants of Health   Financial Resource Strain:   . Difficulty of Paying Living Expenses: Not on file  Food Insecurity:   . Worried About Programme researcher, broadcasting/film/video in the Last Year: Not on file  . Ran Out of Food in the Last Year: Not on file  Transportation Needs:   . Lack of Transportation (Medical): Not on file  . Lack of Transportation (Non-Medical): Not on file  Physical Activity:   . Days of Exercise per Week: Not on file  . Minutes of Exercise per Session: Not on file  Stress:   . Feeling of Stress : Not on file  Social Connections:   . Frequency of Communication with Friends and Family: Not on file  . Frequency of Social Gatherings with Friends and Family: Not on file  . Attends Religious Services: Not on file  . Active Member of Clubs or Organizations: Not on file  . Attends Banker Meetings: Not on file  . Marital Status: Not on file  Intimate Partner Violence:   . Fear of Current or Ex-Partner:  Not on file  . Emotionally Abused: Not on file  . Physically Abused: Not on file  . Sexually Abused: Not on file   Family History  Problem Relation Age of Onset  . Cancer Mother   . Heart failure Father      OBJECTIVE:  Vitals:   06/15/20 1209  BP: (!) 144/92  Pulse: 77  Resp: 20  Temp: 98.1 F (36.7 C)  SpO2: 97%     General appearance: Alert, appears mildly fatigued, but nontoxic; speaking in full sentences without difficulty HEENT:NCAT; Ears: EACs clear, TMs pearly gray; Eyes: PERRL.  EOM grossly intact. Nose: nares patent without rhinorrhea; Throat: tonsils nonerythematous or enlarged, uvula midline  Neck: supple without LAD Lungs: harsh wheezes and rhonchi heard throughout bilateral lungs; normal respiratory effort; moderate cough present Heart: regular rate and rhythm.   Skin: warm and  dry Psychological: alert and cooperative; normal mood and affect   ASSESSMENT & PLAN:  1. Cough   2. Wheezing     Meds ordered this encounter  Medications  . chlorpheniramine-HYDROcodone (TUSSIONEX PENNKINETIC ER) 10-8 MG/5ML SUER    Sig: Take 5 mLs by mouth every 12 (twelve) hours as needed for cough.    Dispense:  50 mL    Refill:  0    Order Specific Question:   Supervising Provider    Answer:   Eustace Moore [[id number]]  . albuterol (VENTOLIN HFA) 108 (90 Base) MCG/ACT inhaler 2 puff  . methylPREDNISolone sodium succinate (SOLU-MEDROL) 125 mg/2 mL injection 125 mg    No orders of the defined types were placed in this encounter.  Due to worsening symptoms offered patient further evaluation and management in the ED.  Unable to do breathing treatment in urgent care setting.  Unable to rule out blood clot in urgent care setting.  Offered patient further evaluation and management in the ED.  Patient declines at this time and would like to try outpatient therapy first.  Aware of the risk associated with this decision including missed diagnosis, organ damage, organ failure, and/or death.  Patient aware and in agreement.     Get plenty of rest and push fluids Solu-medrol injection given in office Albuterol inhaler given in office tussinex cough syrup given Continue with prednisone taper Follow up with PCP for recheck Return or go to ER if you have any new or worsening symptoms such as fever, chills, fatigue, shortness of breath, wheezing, chest pain, nausea, changes in bowel or bladder habits, etc...  Reviewed expectations re: course of current medical issues. Questions answered. Outlined signs and symptoms indicating need for more acute intervention. Patient verbalized understanding. After Visit Summary given.          Rennis Harding, PA-C 06/15/20 1255

## 2020-06-15 NOTE — ED Triage Notes (Signed)
Pt presents with worsening cough after completing antibiotic, tesslon gave adverse reaction according to patient it worsened her cough

## 2020-07-06 ENCOUNTER — Other Ambulatory Visit: Payer: Self-pay

## 2020-07-06 ENCOUNTER — Ambulatory Visit
Admission: EM | Admit: 2020-07-06 | Discharge: 2020-07-06 | Disposition: A | Discharge status: Home / Self Care | Payer: 59 | Attending: Emergency Medicine | Admitting: Emergency Medicine

## 2020-07-06 ENCOUNTER — Ambulatory Visit (INDEPENDENT_AMBULATORY_CARE_PROVIDER_SITE_OTHER): Payer: 59

## 2020-07-06 DIAGNOSIS — R079 Chest pain, unspecified: Secondary | ICD-10-CM

## 2020-07-06 DIAGNOSIS — R059 Cough, unspecified: Secondary | ICD-10-CM

## 2020-07-06 DIAGNOSIS — Z720 Tobacco use: Secondary | ICD-10-CM | POA: Diagnosis not present

## 2020-07-06 DIAGNOSIS — J208 Acute bronchitis due to other specified organisms: Secondary | ICD-10-CM | POA: Diagnosis not present

## 2020-07-06 DIAGNOSIS — R531 Weakness: Secondary | ICD-10-CM | POA: Diagnosis not present

## 2020-07-06 MED ORDER — AZITHROMYCIN 250 MG PO TABS: 250.0000 mg | ORAL_TABLET | Freq: Every day | ORAL | 0 refills | Status: DC

## 2020-07-06 NOTE — ED Triage Notes (Signed)
Pt presents with c/o continuing cough that hasn't been improved with prednisone

## 2020-07-06 NOTE — ED Provider Notes (Addendum)
[facility]   [id number] 07/06/20 Arrival Time: 1043   Chief Complaint  Patient presents with  . Cough    SUBJECTIVE: History from: patient.  Andrea Daugherty is a 49 y.o. female who presented to the urgent care for complaint of chronic cough with green sputum for the past few days and recurrent chest wall tenderness for the past 5 days.  Denies sick exposure to COVID, flu or strep.  Denies recent travel.  Has tried steroid without relief.  Symptoms are made worse with movement.  Denies previous symptoms in the past.   Denies fever, chills, fatigue, sinus pain, rhinorrhea, sore throat, SOB, wheezing, chest pain, nausea, changes in bowel or bladder habits.     ROS: As per HPI.  All other pertinent ROS negative.     History reviewed. No pertinent past medical history. Past Surgical History:  Procedure Laterality Date  . CESAREAN SECTION    . DILATION AND CURETTAGE OF UTERUS    . NOVASURE ABLATION    . TUBAL LIGATION    . WISDOM TOOTH EXTRACTION     Allergies  Allergen Reactions  . Hydrocodone Rash   No current facility-administered medications on file prior to encounter.   Current Outpatient Medications on File Prior to Encounter  Medication Sig Dispense Refill  . chlorpheniramine-HYDROcodone (TUSSIONEX PENNKINETIC ER) 10-8 MG/5ML SUER Take 5 mLs by mouth every 12 (twelve) hours as needed for cough. 50 mL 0  . Ibuprofen 200 MG CAPS Take by mouth.    . predniSONE (STERAPRED UNI-PAK 21 TAB) 10 MG (21) TBPK tablet Take by mouth daily. Take 6 tabs by mouth daily  for 2 days, then 5 tabs for 2 days, then 4 tabs for 2 days, then 3 tabs for 2 days, 2 tabs for 2 days, then 1 tab by mouth daily for 2 days 42 tablet 0   Social History   Socioeconomic History  . Marital status: Married    Spouse name: Not on file  . Number of children: Not on file  . Years of education: 22  . Highest education level: Not on file  Occupational History    Employer: [employer]   Tobacco Use  . Smoking status: Current Every Day Smoker    Packs/day: 1.00    Years: 28.00    Pack years: 28.00    Types: Cigarettes  . Smokeless tobacco: Never Used  Substance and Sexual Activity  . Alcohol use: No  . Drug use: No  . Sexual activity: Yes    Birth control/protection: Surgical  Other Topics Concern  . Not on file  Social History Narrative  . Not on file   Social Determinants of Health   Financial Resource Strain: Not on file  Food Insecurity: Not on file  Transportation Needs: Not on file  Physical Activity: Not on file  Stress: Not on file  Social Connections: Not on file  Intimate Partner Violence: Not on file   Family History  Problem Relation Age of Onset  . Cancer Mother   . Heart failure Father     OBJECTIVE:  Vitals:   07/06/20 1116  BP: (!) 142/91  Pulse: 74  Resp: 20  Temp: 98 F (36.7 C)  SpO2: 96%     General appearance: alert; appears fatigued, but nontoxic; speaking in full sentences and tolerating own secretions HEENT: NCAT; Ears: EACs clear, TMs pearly gray; Eyes: PERRL.  EOM grossly intact. Sinuses: nontender; Nose: nares patent without rhinorrhea, Throat: oropharynx clear, tonsils  non erythematous or enlarged, uvula midline  Neck: supple without LAD Lungs: unlabored respirations, symmetrical air entry; cough: moderate; no respiratory distress; CTAB Heart: regular rate and rhythm.  Radial pulses 2+ symmetrical bilaterally Skin: warm and dry Psychological: alert and cooperative; normal mood and affect Musc: Chest wall tenderness present on palpation LABS:  No results found for this or any previous visit (from the past 24 hour(s)).   RADIOLOGY:  DG Chest 2 View  Result Date: 07/06/2020 CLINICAL DATA:  Chest pain for 11 months. Recent cough. Weakness. Current smoker. EXAM: CHEST - 2 VIEW COMPARISON:  None. FINDINGS: The heart size and mediastinal contours are within normal limits. Both lungs are clear. The visualized  skeletal structures are unremarkable. IMPRESSION: No active cardiopulmonary disease. Electronically Signed   By: Signa Kell M.D.   On: 07/06/2020 11:35    Chest X-ray is negative for cardiopulmonary disease. I have reviewed the x-ray myself and the radiologist interpretation.  I am in agreement with the radiologist interpretation.   ASSESSMENT & PLAN:  1. Acute bronchitis due to other specified organisms     Meds ordered this encounter  Medications  . azithromycin (ZITHROMAX) 250 MG tablet    Sig: Take 1 tablet (250 mg total) by mouth daily. Take first 2 tablets together, then 1 every day until finished.    Dispense:  6 tablet    Refill:  0   Patient is stable at discharge.  Azithromycin was prescribed.  Declined prednisone.  Presented to continue to use ProAir as prescribed Discharge Instructions    Discharge instructions  Azithromycin was prescribed Continue to use ProAir as prescribed She was advised to follow-up with PCP for further evaluation and possible referral for a CT scan Use medications daily for symptom relief Use OTC medications like ibuprofen or tylenol as needed fever or pain Call or go to the ED if you have any new or worsening symptoms such as fever, worsening cough, shortness of breath, chest tightness, chest pain, turning blue, changes in mental status, etc...   Reviewed expectations re: course of current medical issues. Questions answered. Outlined signs and symptoms indicating need for more acute intervention. Patient verbalized understanding. After Visit Summary given.         Durward Parcel, FNP 07/06/20 1217    Durward Parcel, FNP 07/06/20 1220

## 2020-07-06 NOTE — Discharge Instructions (Addendum)
°  Azithromycin was prescribed Continue to use ProAir as prescribed She was advised to follow-up with PCP for further evaluation and possible referral for a CT scan Use medications daily for symptom relief Use OTC medications like ibuprofen or tylenol as needed fever or pain Call or go to the ED if you have any new or worsening symptoms such as fever, worsening cough, shortness of breath, chest tightness, chest pain, turning blue, changes in mental status, etc

## 2020-07-12 ENCOUNTER — Encounter: Payer: Self-pay | Admitting: Emergency Medicine

## 2020-07-12 ENCOUNTER — Other Ambulatory Visit: Payer: Self-pay

## 2020-07-12 ENCOUNTER — Ambulatory Visit
Admission: EM | Admit: 2020-07-12 | Discharge: 2020-07-12 | Disposition: A | Discharge status: Home / Self Care | Payer: 59 | Attending: Emergency Medicine | Admitting: Emergency Medicine

## 2020-07-12 DIAGNOSIS — R053 Chronic cough: Secondary | ICD-10-CM

## 2020-07-12 DIAGNOSIS — Z1152 Encounter for screening for COVID-19: Secondary | ICD-10-CM

## 2020-07-12 MED ORDER — DEXAMETHASONE SODIUM PHOSPHATE 10 MG/ML IJ SOLN
10.0000 mg | Freq: Once | INTRAMUSCULAR | Status: AC
  Administered 2020-07-12: 10 mg via INTRAMUSCULAR

## 2020-07-12 MED ORDER — HYDROCOD POLST-CPM POLST ER 10-8 MG/5ML PO SUER: 5.0000 mL | Freq: Two times a day (BID) | ORAL | 0 refills | Status: DC | PRN

## 2020-07-12 MED ORDER — PREDNISONE 10 MG (21) PO TBPK: ORAL_TABLET | Freq: Every day | ORAL | 0 refills | Status: DC

## 2020-07-12 NOTE — Discharge Instructions (Signed)
COVID testing ordered.  It will take between 2-7 days for test results.  Someone will contact you regarding abnormal results.    In the meantime: You should remain isolated in your home for 10 days from symptom onset AND greater than 24 hours after symptoms resolution (absence of fever without the use of fever-reducing medication and improvement in respiratory symptoms), whichever is longer Get plenty of rest and push fluids Tussinex prescribed for cough Decadron IM was given in office  Prednisone was prescribed/take as directed Use medications daily for symptom relief Use OTC medications like ibuprofen or tylenol as needed fever or pain Call or go to the ED if you have any new or worsening symptoms such as fever, worsening cough, shortness of breath, chest tightness, chest pain, turning blue, changes in mental status, etc..Marland Kitchen

## 2020-07-12 NOTE — ED Triage Notes (Signed)
Patient states that she had exposure to covid at work. - still having bronchitis symptoms.

## 2020-07-12 NOTE — ED Provider Notes (Addendum)
[facility]   [id number] 07/12/20 Arrival Time: 0830   CC: URI SUBJECTIVE: History from: patient and family.  Andrea Daugherty is a 49 y.o. female who presented to the urgent care with a complaint of chronic cough and Covid exposure at work the past few days.  Was previously seen at the urgent care on 07/06/2020.  Has tried Zithromax with no symptom improvement.  Denies sick exposure to  flu or strep.  Denies recent travel.  Denies of aggravating factors.  Denies previous symptoms in the past.   Denies fever, chills, fatigue, sinus pain, rhinorrhea, sore throat, SOB, wheezing, chest pain, nausea, changes in bowel or bladder habits.      ROS: As per HPI.  All other pertinent ROS negative.     History reviewed. No pertinent past medical history. Past Surgical History:  Procedure Laterality Date   CESAREAN SECTION     DILATION AND CURETTAGE OF UTERUS     NOVASURE ABLATION     TUBAL LIGATION     WISDOM TOOTH EXTRACTION     Allergies  Allergen Reactions   Hydrocodone Rash   No current facility-administered medications on file prior to encounter.   Current Outpatient Medications on File Prior to Encounter  Medication Sig Dispense Refill   azithromycin (ZITHROMAX) 250 MG tablet Take 1 tablet (250 mg total) by mouth daily. Take first 2 tablets together, then 1 every day until finished. 6 tablet 0   Ibuprofen 200 MG CAPS Take by mouth.     Social History   Socioeconomic History   Marital status: Married    Spouse name: Not on file   Number of children: Not on file   Years of education: 12   Highest education level: Not on file  Occupational History    Employer: [employer]  Tobacco Use   Smoking status: Current Every Day Smoker    Packs/day: 1.00    Years: 28.00    Pack years: 28.00    Types: Cigarettes   Smokeless tobacco: Never Used  Substance and Sexual Activity   Alcohol use: No   Drug use: No   Sexual activity: Yes    Birth  control/protection: Surgical  Other Topics Concern   Not on file  Social History Narrative   Not on file   Social Determinants of Health   Financial Resource Strain: Not on file  Food Insecurity: Not on file  Transportation Needs: Not on file  Physical Activity: Not on file  Stress: Not on file  Social Connections: Not on file  Intimate Partner Violence: Not on file   Family History  Problem Relation Age of Onset   Cancer Mother    Heart failure Father     OBJECTIVE:  Vitals:   07/12/20 0840  BP: (!) 157/102  Pulse: 78  Resp: 16  Temp: 98 F (36.7 C)  SpO2: 98%     General appearance: alert; appears fatigued, but nontoxic; speaking in full sentences and tolerating own secretions HEENT: NCAT; Ears: EACs clear, TMs pearly gray; Eyes: PERRL.  EOM grossly intact. Sinuses: nontender; Nose: nares patent without rhinorrhea, Throat: oropharynx clear, tonsils non erythematous or enlarged, uvula midline  Neck: supple without LAD Lungs: unlabored respirations, symmetrical air entry; cough: moderate; no respiratory distress; CTAB Heart: regular rate and rhythm.  Radial pulses 2+ symmetrical bilaterally Skin: warm and dry Psychological: alert and cooperative; normal mood and affect  LABS:  No results found for this or any previous visit (from the past  24 hour(s)).   ASSESSMENT & PLAN:  1. Encounter for screening for COVID-19   2. Chronic cough     Meds ordered this encounter  Medications   dexamethasone (DECADRON) injection 10 mg   predniSONE (STERAPRED UNI-PAK 21 TAB) 10 MG (21) TBPK tablet    Sig: Take by mouth daily. Take 6 tabs by mouth daily  for 1 days, then 5 tabs for 1 days, then 4 tabs for 1 days, then 3 tabs for 1 days, 2 tabs for 1 days, then 1 tab by mouth daily for 1 days    Dispense:  21 tablet    Refill:  0   DISCONTD: chlorpheniramine-HYDROcodone (TUSSIONEX PENNKINETIC ER) 10-8 MG/5ML SUER    Sig: Take 5 mLs by mouth every 12 (twelve) hours as  needed for cough.    Dispense:  50 mL    Refill:  0   DISCONTD: chlorpheniramine-HYDROcodone (TUSSIONEX PENNKINETIC ER) 10-8 MG/5ML SUER    Sig: Take 5 mLs by mouth every 12 (twelve) hours as needed for cough.    Dispense:  50 mL    Refill:  0   chlorpheniramine-HYDROcodone (TUSSIONEX PENNKINETIC ER) 10-8 MG/5ML SUER    Sig: Take 5 mLs by mouth every 12 (twelve) hours as needed for cough.    Dispense:  50 mL    Refill:  0   Discharge Instructions  COVID testing ordered.  It will take between 2-7 days for test results.  Someone will contact you regarding abnormal results.    In the meantime: You should remain isolated in your home for 10 days from symptom onset AND greater than 24 hours after symptoms resolution (absence of fever without the use of fever-reducing medication and improvement in respiratory symptoms), whichever is longer Get plenty of rest and push fluids Tussinex prescribed for cough Decadron IM was given in office  Prednisone was prescribed/take as directed Use medications daily for symptom relief Use OTC medications like ibuprofen or tylenol as needed fever or pain Call or go to the ED if you have any new or worsening symptoms such as fever, worsening cough, shortness of breath, chest tightness, chest pain, turning blue, changes in mental status, etc...   Reviewed expectations re: course of current medical issues. Questions answered. Outlined signs and symptoms indicating need for more acute intervention. Patient verbalized understanding. After Visit Summary given.      PDMP reviewed during this encounter.    Durward Parcel, FNP 07/12/20 0931    Durward Parcel, FNP 07/12/20 [phone]

## 2020-07-13 LAB — SARS-COV-2, NAA 2 DAY TAT

## 2020-07-13 LAB — NOVEL CORONAVIRUS, NAA: SARS-CoV-2, NAA: NOT DETECTED

## 2020-07-25 ENCOUNTER — Ambulatory Visit (INDEPENDENT_AMBULATORY_CARE_PROVIDER_SITE_OTHER): Payer: 59

## 2020-07-25 ENCOUNTER — Ambulatory Visit
Admission: EM | Admit: 2020-07-25 | Discharge: 2020-07-25 | Disposition: A | Discharge status: Home / Self Care | Payer: 59 | Attending: Internal Medicine | Admitting: Internal Medicine

## 2020-07-25 ENCOUNTER — Other Ambulatory Visit: Payer: Self-pay

## 2020-07-25 ENCOUNTER — Encounter: Payer: Self-pay | Admitting: Emergency Medicine

## 2020-07-25 DIAGNOSIS — R062 Wheezing: Secondary | ICD-10-CM

## 2020-07-25 DIAGNOSIS — R059 Cough, unspecified: Secondary | ICD-10-CM

## 2020-07-25 DIAGNOSIS — J209 Acute bronchitis, unspecified: Secondary | ICD-10-CM

## 2020-07-25 MED ORDER — ALBUTEROL SULFATE HFA 108 (90 BASE) MCG/ACT IN AERS: 2.0000 | INHALATION_SPRAY | RESPIRATORY_TRACT | 0 refills | Status: DC | PRN

## 2020-07-25 MED ORDER — DOXYCYCLINE HYCLATE 100 MG PO CAPS: 100.0000 mg | ORAL_CAPSULE | Freq: Two times a day (BID) | ORAL | 0 refills | Status: DC

## 2020-07-25 MED ORDER — BUDESONIDE-FORMOTEROL FUMARATE 80-4.5 MCG/ACT IN AERO: 2.0000 | INHALATION_SPRAY | Freq: Two times a day (BID) | RESPIRATORY_TRACT | 0 refills | Status: DC

## 2020-07-25 NOTE — Discharge Instructions (Addendum)
Work on not going back to smoking at all Ask your primary care doctor to send you to a pulmonologist

## 2020-07-25 NOTE — ED Provider Notes (Signed)
[facility]    CSN: [id number] Arrival date & time: 07/25/20  1106      History   Chief Complaint No chief complaint on file.   HPI Andrea Daugherty is a 50 y.o. female who has been coughing since Nov 16th. Was placed on zpack, steroid shot, and steroid pack the following week. She felt she got better She started having productive cough with green mucous 4 days Ago, this am woke up very stuffy and very SOB and wheezing a lot. Has used her inhaler x 3 at 6 am.  She is a smoker and the last time she smoked was 3 hours after walkign up SOB. She does not feel the 2 packs she took took care of her illness fully She started having a low grade temp this am which is new since she has been sick in November. She has never had wheezing last this long. Her CXR from 12/13 was neg.  Denies body aches or HA   History reviewed. No pertinent past medical history.  Patient Active Problem List   Diagnosis Date Noted  . Tennis elbow syndrome 07/11/2013  . Shoulder pain 08/17/2011  . Rotator cuff syndrome of right shoulder 08/17/2011    Past Surgical History:  Procedure Laterality Date  . CESAREAN SECTION    . DILATION AND CURETTAGE OF UTERUS    . NOVASURE ABLATION    . TUBAL LIGATION    . WISDOM TOOTH EXTRACTION      OB History    Gravida  4   Para  3   Term  3   Preterm      AB  1   Living  3     SAB  1   IAB      Ectopic      Multiple      Live Births               Home Medications    Prior to Admission medications   Medication Sig Start Date End Date Taking? Authorizing Provider  albuterol (VENTOLIN HFA) 108 (90 Base) MCG/ACT inhaler Inhale 2 puffs into the lungs every 4 (four) hours as needed for wheezing or shortness of breath. 07/25/20  Yes Rodriguez-Southworth, Sunday Spillers, PA-C  budesonide-formoterol (SYMBICORT) 80-4.5 MCG/ACT inhaler Inhale 2 puffs into the lungs in the morning and at bedtime. For one month 07/25/20  Yes Rodriguez-Southworth, Sunday Spillers,  PA-C  doxycycline (VIBRAMYCIN) 100 MG capsule Take 1 capsule (100 mg total) by mouth 2 (two) times daily. 07/25/20  Yes Rodriguez-Southworth, Sunday Spillers, PA-C  chlorpheniramine-HYDROcodone (TUSSIONEX PENNKINETIC ER) 10-8 MG/5ML SUER Take 5 mLs by mouth every 12 (twelve) hours as needed for cough. 07/12/20   Avegno, Darrelyn Hillock, FNP  Ibuprofen 200 MG CAPS Take by mouth.    [provider]  predniSONE (STERAPRED UNI-PAK 21 TAB) 10 MG (21) TBPK tablet Take by mouth daily. Take 6 tabs by mouth daily  for 1 days, then 5 tabs for 1 days, then 4 tabs for 1 days, then 3 tabs for 1 days, 2 tabs for 1 days, then 1 tab by mouth daily for 1 days 07/12/20   Emerson Monte, FNP    Family History Family History  Problem Relation Age of Onset  . Cancer Mother   . Heart failure Father     Social History Social History   Tobacco Use  . Smoking status: Current Every Day Smoker    Packs/day: 1.00    Years: 28.00  Pack years: 28.00    Types: Cigarettes  . Smokeless tobacco: Never Used  Substance Use Topics  . Alcohol use: No  . Drug use: No     Allergies   Hydrocodone   Review of Systems Review of Systems  Constitutional: Positive for chills, fatigue and fever. Negative for activity change, appetite change and diaphoresis.  HENT: Positive for congestion, postnasal drip and rhinorrhea. Negative for sore throat.   Eyes: Negative for discharge.  Respiratory: Positive for cough, shortness of breath and wheezing. Negative for chest tightness.   Cardiovascular: Negative for chest pain.  Gastrointestinal: Negative for diarrhea and vomiting.  Skin: Negative for rash.  Neurological: Positive for headaches.  Hematological: Negative for adenopathy.     Physical Exam Triage Vital Signs ED Triage Vitals [07/25/20 1137]  Enc Vitals Group     BP 127/88     Pulse Rate 95     Resp 15     Temp 98.3 F (36.8 C)     Temp src      SpO2 95 %     Weight      Height      Head Circumference       Peak Flow      Pain Score      Pain Loc      Pain Edu?      Excl. in GC?    No data found.  Updated Vital Signs BP 127/88   Pulse 95   Temp 98.3 F (36.8 C)   Resp 15   LMP  (LMP Unknown)   SpO2 95%   Visual Acuity Right Eye Distance:   Left Eye Distance:   Bilateral Distance:    Right Eye Near:   Left Eye Near:    Bilateral Near:     Physical Exam Physical Exam Constitutional:      General: He is not in acute distress.    Appearance: He is not toxic-appearing.  HENT:     Head: Normocephalic.     Right Ear: Tympanic membrane, ear canal and external ear normal.     Left Ear: Ear canal and external ear normal.     Nose: Nose normal.     Mouth/Throat:     Mouth: Mucous membranes are moist.     Pharynx: Oropharynx is clear.  Eyes:     General: No scleral icterus.    Conjunctiva/sclera: Conjunctivae normal.  Cardiovascular:     Rate and Rhythm: Normal rate and regular rhythm.     Heart sounds: No murmur heard.   Pulmonary:     Effort: Pulmonary effort is normal. No respiratory distress.     Breath sounds: Wheezing present.     Comments: Has auditory wheezing Musculoskeletal:        General: Normal range of motion.     Cervical back: Neck supple.  Lymphadenopathy:     Cervical: No cervical adenopathy.  Skin:    General: Skin is warm and dry.     Findings: No rash.  Neurological:     Mental Status: He is alert and oriented to person, place, and time.     Gait: Gait normal.  Psychiatric:        Mood and Affect: Mood normal.        Behavior: Behavior normal.        Thought Content: Thought content normal.        Judgment: Judgment normal.     UC Treatments / Results  Labs (all labs  ordered are listed, but only abnormal results are displayed) Labs Reviewed  NOVEL CORONAVIRUS, NAA    EKG   Radiology DG Chest 2 View  Result Date: 07/25/2020 CLINICAL DATA:  Cough and wheezing. EXAM: CHEST - 2 VIEW COMPARISON:  July 06, 2020 FINDINGS: The  heart size and mediastinal contours are within normal limits. Both lungs are clear. The visualized skeletal structures are unremarkable. IMPRESSION: No active cardiopulmonary disease. Electronically Signed   By: Sherian Rein M.D.   On: 07/25/2020 12:22    Procedures Procedures (including critical care time)  Medications Ordered in UC Medications - No data to display  Initial Impression / Assessment and Plan / UC Course  I have reviewed the triage vital signs and the nursing notes. Unresolved bronchtis. I placed her on Doxy and symbicort as noted. Needs to Fu with a pulmonologist and quit  smoking Pertinent  imaging results that were available during my care of the patient were reviewed by me and considered in my medical decision making (see chart for details).   Final Clinical Impressions(s) / UC Diagnoses   Final diagnoses:  Acute bronchitis, unspecified organism     Discharge Instructions     Work on not going back to smoking at all Ask your primary care doctor to send you to a pulmonologist     ED Prescriptions    Medication Sig Dispense Auth. Provider   budesonide-formoterol (SYMBICORT) 80-4.5 MCG/ACT inhaler Inhale 2 puffs into the lungs in the morning and at bedtime. For one month 1 each Rodriguez-Southworth, Nettie Elm, PA-C   albuterol (VENTOLIN HFA) 108 (90 Base) MCG/ACT inhaler Inhale 2 puffs into the lungs every 4 (four) hours as needed for wheezing or shortness of breath. 18 g Rodriguez-Southworth, Nettie Elm, PA-C   doxycycline (VIBRAMYCIN) 100 MG capsule Take 1 capsule (100 mg total) by mouth 2 (two) times daily. 20 capsule Rodriguez-Southworth, Nettie Elm, PA-C     PDMP not reviewed this encounter.   Garey Ham, PA-C 07/25/20 1249

## 2020-07-25 NOTE — ED Triage Notes (Signed)
Patient states that she sinus congestion, cough, wheezing- is using inhaler (almost out), fever this morning (99.5). Cough since November- has had steriod shots, prednisone- CXR was clear - COVID tests were Neg

## 2020-07-28 LAB — NOVEL CORONAVIRUS, NAA: SARS-CoV-2, NAA: NOT DETECTED

## 2020-09-22 ENCOUNTER — Other Ambulatory Visit: Payer: Self-pay

## 2020-09-22 ENCOUNTER — Ambulatory Visit
Admission: EM | Admit: 2020-09-22 | Discharge: 2020-09-22 | Disposition: A | Discharge status: Home / Self Care | Payer: 59 | Attending: Emergency Medicine | Admitting: Emergency Medicine

## 2020-09-22 ENCOUNTER — Encounter: Payer: Self-pay | Admitting: Emergency Medicine

## 2020-09-22 DIAGNOSIS — R062 Wheezing: Secondary | ICD-10-CM

## 2020-09-22 DIAGNOSIS — R059 Cough, unspecified: Secondary | ICD-10-CM

## 2020-09-22 MED ORDER — DEXAMETHASONE SODIUM PHOSPHATE 10 MG/ML IJ SOLN
10.0000 mg | Freq: Once | INTRAMUSCULAR | Status: AC
  Administered 2020-09-22: 10 mg via INTRAMUSCULAR

## 2020-09-22 MED ORDER — ALBUTEROL SULFATE HFA 108 (90 BASE) MCG/ACT IN AERS
2.0000 | INHALATION_SPRAY | Freq: Once | RESPIRATORY_TRACT | Status: AC
  Administered 2020-09-22: 2 via RESPIRATORY_TRACT

## 2020-09-22 MED ORDER — BUDESONIDE-FORMOTEROL FUMARATE 80-4.5 MCG/ACT IN AERO: 2.0000 | INHALATION_SPRAY | Freq: Two times a day (BID) | RESPIRATORY_TRACT | 0 refills | Status: DC

## 2020-09-22 NOTE — ED Triage Notes (Signed)
SOB and wheezing since November.  Audible  wheezing.

## 2020-09-22 NOTE — Discharge Instructions (Addendum)
Get plenty of rest and push fluids Albuterol inhaler given in office Steroid shot given in office symbicort refilled Use OTC medication as needed for symptomatic relief Follow up with PCP for recheck and/or if symptoms persists Return or go to ER if you have any new or worsening symptoms such as fever, chills, fatigue, shortness of breath, wheezing, chest pain, nausea, changes in bowel or bladder habits, etc..Marland Kitchen

## 2020-09-22 NOTE — ED Provider Notes (Signed)
[facility]   [id number] 09/22/20 Arrival Time: 1006  Cc: COUGH  SUBJECTIVE:  Andrea Daugherty is a 50 y.o. female who presents with acute on chronic cough, and wheezing with exacerbation x 1 week.  Denies precipitating event or covid exposure.  Describes cough as intermittent and nonproductive.  Has tried inhaler without relief.  Symptoms are made worse at work.  Reports previous symptoms in the past that improved with symbicort.   Denies fever, chills, sinus pain, rhinorrhea, sore throat, chest pain, nausea, changes in bowel or bladder habits.    ROS: As per HPI.  All other pertinent ROS negative.     History reviewed. No pertinent past medical history. Past Surgical History:  Procedure Laterality Date  . CESAREAN SECTION    . DILATION AND CURETTAGE OF UTERUS    . NOVASURE ABLATION    . TUBAL LIGATION    . WISDOM TOOTH EXTRACTION     Allergies  Allergen Reactions  . Hydrocodone Rash   No current facility-administered medications on file prior to encounter.   Current Outpatient Medications on File Prior to Encounter  Medication Sig Dispense Refill  . albuterol (VENTOLIN HFA) 108 (90 Base) MCG/ACT inhaler Inhale 2 puffs into the lungs every 4 (four) hours as needed for wheezing or shortness of breath. 18 g 0  . chlorpheniramine-HYDROcodone (TUSSIONEX PENNKINETIC ER) 10-8 MG/5ML SUER Take 5 mLs by mouth every 12 (twelve) hours as needed for cough. 50 mL 0  . Ibuprofen 200 MG CAPS Take by mouth.      Social History   Socioeconomic History  . Marital status: Married    Spouse name: Not on file  . Number of children: Not on file  . Years of education: 19  . Highest education level: Not on file  Occupational History    Employer: [employer]  Tobacco Use  . Smoking status: Current Every Day Smoker    Packs/day: 1.00    Years: 28.00    Pack years: 28.00    Types: Cigarettes  . Smokeless tobacco: Never Used  Substance and Sexual Activity  . Alcohol use:  No  . Drug use: No  . Sexual activity: Yes    Birth control/protection: Surgical  Other Topics Concern  . Not on file  Social History Narrative  . Not on file   Social Determinants of Health   Financial Resource Strain: Not on file  Food Insecurity: Not on file  Transportation Needs: Not on file  Physical Activity: Not on file  Stress: Not on file  Social Connections: Not on file  Intimate Partner Violence: Not on file   Family History  Problem Relation Age of Onset  . Cancer Mother   . Heart failure Father      OBJECTIVE:  Vitals:   09/22/20 1207  BP: (!) 147/91  Pulse: 82  Resp: 18  Temp: 97.8 F (36.6 C)  TempSrc: Oral  SpO2: 92%     General appearance: Alert, appears fatigued, but nontoxic; speaking in full sentences without difficulty HEENT:NCAT; Ears: EACs clear, TMs pearly gray; Eyes: PERRL.  EOM grossly intact. Nose: nares patent without rhinorrhea; Throat: tonsils nonerythematous or enlarged, uvula midline  Neck: supple without LAD Lungs: hard pronounced wheezes throughout bilateral lung fields Heart: regular rate and rhythm.   Skin: warm and dry Psychological: alert and cooperative; normal mood and affect  ASSESSMENT & PLAN:  1. Cough   2. Wheezing     Meds ordered this encounter  Medications  .  dexamethasone (DECADRON) injection 10 mg  . albuterol (VENTOLIN HFA) 108 (90 Base) MCG/ACT inhaler 2 puff  . budesonide-formoterol (SYMBICORT) 80-4.5 MCG/ACT inhaler    Sig: Inhale 2 puffs into the lungs in the morning and at bedtime. For one month    Dispense:  1 each    Refill:  0    Order Specific Question:   Supervising Provider    Answer:   Eustace Moore [[id number]]    Get plenty of rest and push fluids Albuterol inhaler given in office Steroid shot given in office symbicort refilled Use OTC medication as needed for symptomatic relief Follow up with PCP for recheck and/or if symptoms persists Return or go to ER if you have any new or  worsening symptoms such as fever, chills, fatigue, shortness of breath, wheezing, chest pain, nausea, changes in bowel or bladder habits, etc...  Reviewed expectations re: course of current medical issues. Questions answered. Outlined signs and symptoms indicating need for more acute intervention. Patient verbalized understanding. After Visit Summary given.          Rennis Harding, PA-C 09/22/20 1238

## 2020-10-15 ENCOUNTER — Encounter: Payer: Self-pay | Admitting: Emergency Medicine

## 2020-10-15 ENCOUNTER — Other Ambulatory Visit: Payer: Self-pay

## 2020-10-15 ENCOUNTER — Ambulatory Visit
Admission: EM | Admit: 2020-10-15 | Discharge: 2020-10-15 | Disposition: A | Discharge status: Home / Self Care | Payer: 59 | Attending: Emergency Medicine | Admitting: Emergency Medicine

## 2020-10-15 DIAGNOSIS — R059 Cough, unspecified: Secondary | ICD-10-CM

## 2020-10-15 DIAGNOSIS — R062 Wheezing: Secondary | ICD-10-CM

## 2020-10-15 DIAGNOSIS — J42 Unspecified chronic bronchitis: Secondary | ICD-10-CM

## 2020-10-15 MED ORDER — PREDNISONE 10 MG (21) PO TBPK: ORAL_TABLET | Freq: Every day | ORAL | 0 refills | Status: DC

## 2020-10-15 MED ORDER — DEXAMETHASONE SODIUM PHOSPHATE 10 MG/ML IJ SOLN
10.0000 mg | Freq: Once | INTRAMUSCULAR | Status: AC
  Administered 2020-10-15: 10 mg via INTRAMUSCULAR

## 2020-10-15 MED ORDER — BENZONATATE 100 MG PO CAPS: 100.0000 mg | ORAL_CAPSULE | Freq: Three times a day (TID) | ORAL | 0 refills | Status: DC

## 2020-10-15 MED ORDER — ALBUTEROL SULFATE HFA 108 (90 BASE) MCG/ACT IN AERS
2.0000 | INHALATION_SPRAY | Freq: Once | RESPIRATORY_TRACT | Status: AC
  Administered 2020-10-15: 2 via RESPIRATORY_TRACT

## 2020-10-15 NOTE — ED Provider Notes (Signed)
[facility]   [id number] 10/15/20 Arrival Time: 1605  Cc: COUGH  SUBJECTIVE:  Andrea Daugherty is a 50 y.o. female who presents with bronchitis flare x 1 week.  Reports nonproductive cough, wheezing, and SOB x 1 week.  Denies sick exposure or precipitating event.  Describes cough as intermittent and dry.  Has tried steroids and inhaler in the past relief.  Denies aggravating factors.  Reports previous symptoms in the past.   Denies fever, chills, sore throat, chest pain, nausea, changes in bowel or bladder habits.    ROS: As per HPI.  All other pertinent ROS negative.     History reviewed. No pertinent past medical history. Past Surgical History:  Procedure Laterality Date  . CESAREAN SECTION    . DILATION AND CURETTAGE OF UTERUS    . NOVASURE ABLATION    . TUBAL LIGATION    . WISDOM TOOTH EXTRACTION     Allergies  Allergen Reactions  . Hydrocodone Rash   No current facility-administered medications on file prior to encounter.   Current Outpatient Medications on File Prior to Encounter  Medication Sig Dispense Refill  . albuterol (VENTOLIN HFA) 108 (90 Base) MCG/ACT inhaler Inhale 2 puffs into the lungs every 4 (four) hours as needed for wheezing or shortness of breath. 18 g 0  . budesonide-formoterol (SYMBICORT) 80-4.5 MCG/ACT inhaler Inhale 2 puffs into the lungs in the morning and at bedtime. For one month 1 each 0  . chlorpheniramine-HYDROcodone (TUSSIONEX PENNKINETIC ER) 10-8 MG/5ML SUER Take 5 mLs by mouth every 12 (twelve) hours as needed for cough. 50 mL 0  . Ibuprofen 200 MG CAPS Take by mouth.      Social History   Socioeconomic History  . Marital status: Married    Spouse name: Not on file  . Number of children: Not on file  . Years of education: 16  . Highest education level: Not on file  Occupational History    Employer: [employer]  Tobacco Use  . Smoking status: Current Every Day Smoker    Packs/day: 1.00    Years: 28.00    Pack  years: 28.00    Types: Cigarettes  . Smokeless tobacco: Never Used  Substance and Sexual Activity  . Alcohol use: No  . Drug use: No  . Sexual activity: Yes    Birth control/protection: Surgical  Other Topics Concern  . Not on file  Social History Narrative  . Not on file   Social Determinants of Health   Financial Resource Strain: Not on file  Food Insecurity: Not on file  Transportation Needs: Not on file  Physical Activity: Not on file  Stress: Not on file  Social Connections: Not on file  Intimate Partner Violence: Not on file   Family History  Problem Relation Age of Onset  . Cancer Mother   . Heart failure Father      OBJECTIVE:  Vitals:   10/15/20 1702  BP: 130/84  Pulse: 89  Resp: 18  Temp: 98 F (36.7 C)  TempSrc: Oral  SpO2: 91%    General appearance: Alert, well-appearing nontoxic; speaking in full sentences without difficulty HEENT:NCAT; Ears: EACs clear, TMs pearly gray; Eyes: PERRL.  EOM grossly intact. Nose: nares patent without rhinorrhea; Throat: tonsils nonerythematous or enlarged, uvula midline  Neck: supple without LAD Lungs: harsh wheezes heard throughout bilateral lung fields; normal respiratory effort; mild cough present Heart: regular rate and rhythm.   Skin: warm and dry Psychological: alert and cooperative; normal  mood and affect  ASSESSMENT & PLAN:  1. Cough   2. Wheezing   3. Chronic bronchitis, unspecified chronic bronchitis type (HCC)    Meds ordered this encounter  Medications  . predniSONE (STERAPRED UNI-PAK 21 TAB) 10 MG (21) TBPK tablet    Sig: Take by mouth daily. Take 6 tabs by mouth daily  for 2 days, then 5 tabs for 2 days, then 4 tabs for 2 days, then 3 tabs for 2 days, 2 tabs for 2 days, then 1 tab by mouth daily for 2 days    Dispense:  42 tablet    Refill:  0    Order Specific Question:   Supervising Provider    Answer:   Eustace Moore [[id number]]  . benzonatate (TESSALON) 100 MG capsule    Sig: Take 1  capsule (100 mg total) by mouth every 8 (eight) hours.    Dispense:  21 capsule    Refill:  0    Order Specific Question:   Supervising Provider    Answer:   Eustace Moore [[id number]]  . dexamethasone (DECADRON) injection 10 mg  . albuterol (VENTOLIN HFA) 108 (90 Base) MCG/ACT inhaler 2 puff    No orders of the defined types were placed in this encounter.   Steroid shot given in office Albuterol inhaler given in office Get plenty of rest and push fluids Prescribed tessolone perles as needed for cough Prednisone taper prescribed Use OTC medication as needed for symptomatic relief Follow up with PCP for recheck and/or if symptoms persists Return or go to ER if you have any new or worsening symptoms such as fever, chills, fatigue, shortness of breath, wheezing, chest pain, nausea, changes in bowel or bladder habits, etc...  Reviewed expectations re: course of current medical issues. Questions answered. Outlined signs and symptoms indicating need for more acute intervention. Patient verbalized understanding. After Visit Summary given.          Rennis Harding, PA-C 10/15/20 1745

## 2020-10-15 NOTE — ED Triage Notes (Signed)
Wheezing and coughing since November.

## 2020-10-15 NOTE — Discharge Instructions (Signed)
Steroid shot given in office Albuterol inhaler given in office Get plenty of rest and push fluids Prescribed tessolone perles as needed for cough Prednisone taper prescribed Use OTC medication as needed for symptomatic relief Follow up with PCP for recheck and/or if symptoms persists Return or go to ER if you have any new or worsening symptoms such as fever, chills, fatigue, shortness of breath, wheezing, chest pain, nausea, changes in bowel or bladder habits, etc..Marland Kitchen

## 2021-06-28 ENCOUNTER — Encounter: Payer: Self-pay | Admitting: Emergency Medicine

## 2021-06-28 ENCOUNTER — Other Ambulatory Visit: Payer: Self-pay

## 2021-06-28 ENCOUNTER — Ambulatory Visit
Admission: EM | Admit: 2021-06-28 | Discharge: 2021-06-28 | Disposition: A | Discharge status: Home / Self Care | Payer: 59 | Attending: Physician Assistant | Admitting: Physician Assistant

## 2021-06-28 DIAGNOSIS — R059 Cough, unspecified: Secondary | ICD-10-CM | POA: Diagnosis not present

## 2021-06-28 MED ORDER — ALBUTEROL SULFATE HFA 108 (90 BASE) MCG/ACT IN AERS: 2.0000 | INHALATION_SPRAY | RESPIRATORY_TRACT | 2 refills | Status: DC | PRN

## 2021-06-28 MED ORDER — PREDNISONE 50 MG PO TABS: ORAL_TABLET | ORAL | 0 refills | Status: DC

## 2021-06-28 MED ORDER — ACETAMINOPHEN 500 MG PO TABS
1000.0000 mg | ORAL_TABLET | Freq: Once | ORAL | Status: AC
  Administered 2021-06-28: 1000 mg via ORAL

## 2021-06-28 NOTE — ED Triage Notes (Addendum)
Pt reports woke up with generalized body aches,cough this morning.pt reports history of bronchitis and history of similar symptoms when diagnosed with flu in the past.

## 2021-06-29 LAB — COVID-19, FLU A+B NAA
Influenza A, NAA: NOT DETECTED
Influenza B, NAA: NOT DETECTED
SARS-CoV-2, NAA: DETECTED — AB

## 2021-07-02 NOTE — ED Provider Notes (Signed)
[facility]    CSN: [id number] Arrival date & time: 06/28/21  0957      History   Chief Complaint Chief Complaint  Patient presents with   Generalized Body Aches    HPI Andrea Daugherty is a 50 y.o. female.   The history is provided by the patient. No language interpreter was used.  Cough Cough characteristics:  Non-productive Sputum characteristics:  Nondescript Severity:  Moderate Onset quality:  Gradual Duration:  1 day Timing:  Constant Progression:  Worsening Chronicity:  New Smoker: no   Relieved by:  Nothing Worsened by:  Nothing Ineffective treatments:  None tried  History reviewed. No pertinent past medical history.  Patient Active Problem List   Diagnosis Date Noted   Tennis elbow syndrome 07/11/2013   Shoulder pain 08/17/2011   Rotator cuff syndrome of right shoulder 08/17/2011    Past Surgical History:  Procedure Laterality Date   CESAREAN SECTION     DILATION AND CURETTAGE OF UTERUS     NOVASURE ABLATION     TUBAL LIGATION     WISDOM TOOTH EXTRACTION      OB History     Gravida  4   Para  3   Term  3   Preterm      AB  1   Living  3      SAB  1   IAB      Ectopic      Multiple      Live Births               Home Medications    Prior to Admission medications   Medication Sig Start Date End Date Taking? Authorizing Provider  predniSONE (DELTASONE) 50 MG tablet One tablet a day 06/28/21  Yes Elson Areas, PA-C  albuterol (VENTOLIN HFA) 108 (90 Base) MCG/ACT inhaler Inhale 2 puffs into the lungs every 4 (four) hours as needed for wheezing or shortness of breath. 06/28/21   Elson Areas, PA-C  benzonatate (TESSALON) 100 MG capsule Take 1 capsule (100 mg total) by mouth every 8 (eight) hours. Patient not taking: Reported on 06/28/2021 10/15/20   Wurst, [place], PA-C  budesonide-formoterol [facility]) 80-4.5 MCG/ACT inhaler Inhale 2 puffs into the lungs in the morning and at bedtime. For one month Patient  not taking: Reported on 06/28/2021 09/22/20   Wurst, [place], PA-C  chlorpheniramine-HYDROcodone [facility] ER) 10-8 MG/5ML SUER Take 5 mLs by mouth every 12 (twelve) hours as needed for cough. 07/12/20   Avegno, Zachery Dakins, FNP  Ibuprofen 200 MG CAPS Take by mouth.    [provider]    Family History Family History  Problem Relation Age of Onset   Cancer Mother    Heart failure Father     Social History Social History   Tobacco Use   Smoking status: Every Day    Packs/day: 0.50    Years: 28.00    Pack years: 14.00    Types: Cigarettes   Smokeless tobacco: Never  Substance Use Topics   Alcohol use: No   Drug use: No     Allergies   Hydrocodone   Review of Systems Review of Systems  Respiratory:  Positive for cough.   All other systems reviewed and are negative.   Physical Exam Triage Vital Signs ED Triage Vitals  Enc Vitals Group     BP 06/28/21 1107 138/89     Pulse Rate 06/28/21 1107 (!) 104     Resp 06/28/21  1107 18     Temp 06/28/21 1107 (!) 100.6 F (38.1 C)     Temp Source 06/28/21 1107 Oral     SpO2 06/28/21 1107 94 %     Weight 06/28/21 1104 138 lb (62.6 kg)     Height 06/28/21 1104 5\' 1"  (1.549 m)     Head Circumference --      Peak Flow --      Pain Score 06/28/21 1103 8     Pain Loc --      Pain Edu? --      Excl. in GC? --    No data found.  Updated Vital Signs BP 138/89 (BP Location: Right Arm)   Pulse (!) 104   Temp (!) 100.6 F (38.1 C) (Oral)   Resp 18   Ht 5\' 1"  (1.549 m)   Wt 62.6 kg   LMP  (LMP Unknown)   SpO2 94%   BMI 26.07 kg/m   Visual Acuity Right Eye Distance:   Left Eye Distance:   Bilateral Distance:    Right Eye Near:   Left Eye Near:    Bilateral Near:     Physical Exam Vitals and nursing note reviewed.  Constitutional:      Appearance: She is well-developed.  HENT:     Head: Normocephalic.  Cardiovascular:     Rate and Rhythm: Normal rate.  Pulmonary:     Effort: Pulmonary  effort is normal.  Abdominal:     General: There is no distension.  Musculoskeletal:        General: Normal range of motion.     Cervical back: Normal range of motion.  Skin:    General: Skin is warm.  Neurological:     Mental Status: She is alert and oriented to person, place, and time.     UC Treatments / Results  Labs (all labs ordered are listed, but only abnormal results are displayed) Labs Reviewed  COVID-19, FLU A+B NAA - Abnormal; Notable for the following components:      Result Value   SARS-CoV-2, NAA Detected (*)    All other components within normal limits   Narrative:    Performed at:  [address], [place], [address]  [place] Lab Director: [place] MD, Phone:  [phone]    EKG   Radiology No results found.  Procedures Procedures (including critical care time)  Medications Ordered in UC Medications  acetaminophen (TYLENOL) tablet 1,000 mg (1,000 mg Oral Given 06/28/21 1110)    Initial Impression / Assessment and Plan / UC Course  I have reviewed the triage vital signs and the nursing notes.  Pertinent labs & imaging results that were available during my care of the patient were reviewed by me and considered in my medical decision making (see chart for details).      Final Clinical Impressions(s) / UC Diagnoses   Final diagnoses:  Cough, unspecified type   Discharge Instructions   None    ED Prescriptions     Medication Sig Dispense Auth. Provider   albuterol (VENTOLIN HFA) 108 (90 Base) MCG/ACT inhaler Inhale 2 puffs into the lungs every 4 (four) hours as needed for wheezing or shortness of breath. 18 g Tvisha Schwoerer K, PA-C   predniSONE (DELTASONE) 50 MG tablet One tablet a day 6 tablet 07-05-1976, 03-12-1982      PDMP not reviewed this encounter. An After Visit Summary was printed and given to the patient.  Elson Areas, [place] 07/02/21 1948

## 2021-07-21 ENCOUNTER — Ambulatory Visit
Admission: EM | Admit: 2021-07-21 | Discharge: 2021-07-21 | Disposition: A | Discharge status: Home / Self Care | Payer: 59 | Attending: Urgent Care | Admitting: Urgent Care

## 2021-07-21 ENCOUNTER — Other Ambulatory Visit: Payer: Self-pay

## 2021-07-21 ENCOUNTER — Ambulatory Visit (INDEPENDENT_AMBULATORY_CARE_PROVIDER_SITE_OTHER): Payer: 59

## 2021-07-21 DIAGNOSIS — R062 Wheezing: Secondary | ICD-10-CM | POA: Diagnosis not present

## 2021-07-21 DIAGNOSIS — F17200 Nicotine dependence, unspecified, uncomplicated: Secondary | ICD-10-CM

## 2021-07-21 DIAGNOSIS — R059 Cough, unspecified: Secondary | ICD-10-CM

## 2021-07-21 DIAGNOSIS — R051 Acute cough: Secondary | ICD-10-CM

## 2021-07-21 DIAGNOSIS — Z8616 Personal history of COVID-19: Secondary | ICD-10-CM | POA: Diagnosis not present

## 2021-07-21 MED ORDER — PREDNISONE 20 MG PO TABS: ORAL_TABLET | ORAL | 0 refills | Status: DC

## 2021-07-21 MED ORDER — PROMETHAZINE-DM 6.25-15 MG/5ML PO SYRP: 5.0000 mL | ORAL_SOLUTION | Freq: Every evening | ORAL | 0 refills | Status: DC | PRN

## 2021-07-21 MED ORDER — METHYLPREDNISOLONE SODIUM SUCC 125 MG IJ SOLR
125.0000 mg | Freq: Once | INTRAMUSCULAR | Status: AC
  Administered 2021-07-21: 16:00:00 125 mg via INTRAMUSCULAR

## 2021-07-21 MED ORDER — BENZONATATE 100 MG PO CAPS: 100.0000 mg | ORAL_CAPSULE | Freq: Three times a day (TID) | ORAL | 0 refills | Status: DC

## 2021-07-21 NOTE — ED Provider Notes (Signed)
[facility]   MRN: [id number] DOB: 09/26/1970  Subjective:   Andrea Daugherty is a 50 y.o. female presenting for 3 week history of persistent cough, wheezing. Cough elicits back pain. Had COVID 3 weeks ago.  Patient is a smoker, has longstanding history.  Currently smokes 1/2ppd.  She would like to make sure she does not have bronchitis that she had this in the past.  Has never been diagnosed with asthma or COPD but has been prescribed Symbicort which helped but is quite expensive.  She has been using her albuterol daily.  From December 5, patient did undergo a steroid course which did help but her symptoms are still persisting.  No current facility-administered medications for this encounter.  Current Outpatient Medications:    albuterol (VENTOLIN HFA) 108 (90 Base) MCG/ACT inhaler, Inhale 2 puffs into the lungs every 4 (four) hours as needed for wheezing or shortness of breath., Disp: 18 g, Rfl: 2   benzonatate (TESSALON) 100 MG capsule, Take 1 capsule (100 mg total) by mouth every 8 (eight) hours. (Patient not taking: Reported on 06/28/2021), Disp: 21 capsule, Rfl: 0   budesonide-formoterol (SYMBICORT) 80-4.5 MCG/ACT inhaler, Inhale 2 puffs into the lungs in the morning and at bedtime. For one month (Patient not taking: Reported on 06/28/2021), Disp: 1 each, Rfl: 0   chlorpheniramine-HYDROcodone (TUSSIONEX PENNKINETIC ER) 10-8 MG/5ML SUER, Take 5 mLs by mouth every 12 (twelve) hours as needed for cough., Disp: 50 mL, Rfl: 0   Ibuprofen 200 MG CAPS, Take by mouth., Disp: , Rfl:    predniSONE (DELTASONE) 50 MG tablet, One tablet a day, Disp: 6 tablet, Rfl: 0   Allergies  Allergen Reactions   Hydrocodone Rash    History reviewed. No pertinent past medical history.   Past Surgical History:  Procedure Laterality Date   CESAREAN SECTION     DILATION AND CURETTAGE OF UTERUS     NOVASURE ABLATION     TUBAL LIGATION     WISDOM TOOTH EXTRACTION      Family History  Problem  Relation Age of Onset   Cancer Mother    Heart failure Father     Social History   Tobacco Use   Smoking status: Every Day    Packs/day: 0.50    Years: 28.00    Pack years: 14.00    Types: Cigarettes   Smokeless tobacco: Never  Substance Use Topics   Alcohol use: No   Drug use: No    ROS   Objective:   Vitals: BP (!) 149/82 (BP Location: Right Arm)    Pulse 93    Temp 98.7 F (37.1 C) (Oral)    Resp 19    LMP  (LMP Unknown)    SpO2 94%   Physical Exam Constitutional:      General: She is not in acute distress.    Appearance: Normal appearance. She is well-developed. She is not ill-appearing, toxic-appearing or diaphoretic.  HENT:     Head: Normocephalic and atraumatic.     Nose: Nose normal.     Mouth/Throat:     Mouth: Mucous membranes are moist.  Eyes:     Extraocular Movements: Extraocular movements intact.     Pupils: Pupils are equal, round, and reactive to light.  Cardiovascular:     Rate and Rhythm: Normal rate and regular rhythm.     Pulses: Normal pulses.     Heart sounds: Normal heart sounds. No murmur heard.   No friction rub. No gallop.  Pulmonary:     Effort: Pulmonary effort is normal. No respiratory distress.     Breath sounds: No stridor. Rhonchi (left worse than right over mid to lower lung fields bilaterally) present. No wheezing or rales.  Skin:    General: Skin is warm and dry.     Findings: No rash.  Neurological:     Mental Status: She is alert and oriented to person, place, and time.  Psychiatric:        Mood and Affect: Mood normal.        Behavior: Behavior normal.        Thought Content: Thought content normal.    DG Chest 2 View  Result Date: 07/21/2021 CLINICAL DATA:  Cough and wheezing EXAM: CHEST - 2 VIEW COMPARISON:  07/25/2020 FINDINGS: The heart size and mediastinal contours are within normal limits. Both lungs are clear. Mild scoliosis. IMPRESSION: No active cardiopulmonary disease. Electronically Signed   By: Jasmine Pang M.D.   On: 07/21/2021 15:41    Assessment and Plan :   PDMP not reviewed this encounter.  1. Acute cough   2. Smoker   3. History of COVID-19    IM Solu-Medrol in clinic followed by an oral steroid course starting tomorrow.  Patient ultimately declined to have her refill of Symbicort.  Recommended continued use of albuterol.  Use supportive care otherwise especially for cough. Counseled patient on potential for adverse effects with medications prescribed/recommended today, ER and return-to-clinic precautions discussed, patient verbalized understanding.    Wallis Bamberg, PA-C 07/21/21 1601

## 2021-07-21 NOTE — ED Triage Notes (Signed)
Pt reports sore throat, back pain and chest discomfort when coughing. Pt states she feel she has bronchitis as she had 1 year ago,. P reports she had COVID over 3 weeks ago.

## 2021-10-12 ENCOUNTER — Ambulatory Visit
Admission: EM | Admit: 2021-10-12 | Discharge: 2021-10-12 | Disposition: A | Discharge status: Home / Self Care | Payer: 59 | Attending: Family Medicine | Admitting: Family Medicine

## 2021-10-12 ENCOUNTER — Ambulatory Visit: Payer: 59

## 2021-10-12 ENCOUNTER — Other Ambulatory Visit: Payer: Self-pay

## 2021-10-12 ENCOUNTER — Encounter: Payer: Self-pay | Admitting: Emergency Medicine

## 2021-10-12 DIAGNOSIS — F17200 Nicotine dependence, unspecified, uncomplicated: Secondary | ICD-10-CM

## 2021-10-12 DIAGNOSIS — R062 Wheezing: Secondary | ICD-10-CM | POA: Diagnosis not present

## 2021-10-12 MED ORDER — BUDESONIDE-FORMOTEROL FUMARATE 80-4.5 MCG/ACT IN AERO
2.0000 | INHALATION_SPRAY | Freq: Two times a day (BID) | RESPIRATORY_TRACT | 2 refills | Status: DC
Start: 2021-10-12 — End: 2022-04-26

## 2021-10-12 MED ORDER — METHYLPREDNISOLONE SODIUM SUCC 125 MG IJ SOLR
125.0000 mg | Freq: Once | INTRAMUSCULAR | Status: AC
  Administered 2021-10-12: 125 mg via INTRAMUSCULAR

## 2021-10-12 MED ORDER — BENZONATATE 100 MG PO CAPS
ORAL_CAPSULE | ORAL | 0 refills | Status: DC
Start: 2021-10-12 — End: 2022-04-26

## 2021-10-12 MED ORDER — PREDNISONE 10 MG (48) PO TBPK: ORAL_TABLET | ORAL | 0 refills | Status: DC

## 2021-10-12 NOTE — ED Notes (Signed)
Pt refused chest xray and reported did not feel it to be indicated at this time when rad tech came to get pt for imaging. ?

## 2021-10-12 NOTE — ED Triage Notes (Signed)
Pt reports covid and bronchitis since December. Pt reports continued cough and intermittent wheezing and reports is on last albuterol inhaler. Pt denies any other symptoms or known fevers at this time. ?

## 2021-10-13 NOTE — ED Provider Notes (Signed)
?[facility] ? ? ?[id number] ?10/12/21 Arrival Time: 1331 ? ?ASSESSMENT & PLAN: ? ?1. Wheezing   ?2. Smoker   ? ?Work note provided. ? ?Meds ordered this encounter  ?Medications  ? budesonide-formoterol (SYMBICORT) 80-4.5 MCG/ACT inhaler  ?  Sig: Inhale 2 puffs into the lungs in the morning and at bedtime. For one month  ?  Dispense:  1 each  ?  Refill:  2  ? predniSONE (STERAPRED UNI-PAK 48 TAB) 10 MG (48) TBPK tablet  ?  Sig: Take as directed.  ?  Dispense:  48 tablet  ?  Refill:  0  ? benzonatate (TESSALON) 100 MG capsule  ?  Sig: Take 1 capsule by mouth every 8 (eight) hours for cough.  ?  Dispense:  21 capsule  ?  Refill:  0  ? methylPREDNISolone sodium succinate (SOLU-MEDROL) 125 mg/2 mL injection 125 mg  ? ?No resp distress here. ? ? Follow-up Information   ? ? Schedule an appointment as soon as possible for a visit  with [facility].   ?Specialty: Family Medicine ?Contact information: ?[address] ?[address] ?[place] [address] ?[phone] ? ? ?  ?  ? ?  ?  ? ?  ? ? ?Reviewed expectations re: course of current medical issues. Questions answered. ?Outlined signs and symptoms indicating need for more acute intervention. ?Understanding verbalized. ?After Visit Summary given. ? ? ?SUBJECTIVE: ?History from: Patient. ?Andrea Daugherty is a 51 y.o. female. Pt reports covid and bronchitis since December. Pt reports continued cough and intermittent wheezing and reports is on last albuterol inhaler. Pt denies any other symptoms or known fevers at this time. ?Denies: difficulty breathing. Normal PO intake without n/v/d. ? ?OBJECTIVE: ? ?Vitals:  ? 10/12/21 1512 10/12/21 1513  ?BP: (!) 137/95   ?Pulse: 90   ?Resp: 18   ?Temp: 98.3 ?F (36.8 ?C)   ?TempSrc: Oral   ?SpO2: 90%   ?Weight:  57.6 kg  ?Height:  5' 1.5" (1.562 m)  ?  ?General appearance: alert; no distress ?Eyes: PERRLA; EOMI; conjunctiva normal ?HENT: [place]; AT; without nasal congestion ?Neck: supple  ?Lungs: speaks full  sentences without difficulty; unlabored; bilateral marked exp wheezing ?Extremities: no edema ?Skin: warm and dry ?Neurologic: normal gait ?Psychological: alert and cooperative; normal mood and affect ? ?Allergies  ?Allergen Reactions  ? Hydrocodone Rash  ? ? ?History reviewed. No pertinent past medical history. ?Social History  ? ?Socioeconomic History  ? Marital status: Married  ?  Spouse name: Not on file  ? Number of children: Not on file  ? Years of education: 52  ? Highest education level: Not on file  ?Occupational History  ?  Employer: [employer]  ?Tobacco Use  ? Smoking status: Every Day  ?  Packs/day: 0.50  ?  Years: 28.00  ?  Pack years: 14.00  ?  Types: Cigarettes  ? Smokeless tobacco: Never  ?Substance and Sexual Activity  ? Alcohol use: No  ? Drug use: No  ? Sexual activity: Yes  ?  Birth control/protection: Surgical  ?Other Topics Concern  ? Not on file  ?Social History Narrative  ? Not on file  ? ?Social Determinants of Health  ? ?Financial Resource Strain: Not on file  ?Food Insecurity: Not on file  ?Transportation Needs: Not on file  ?Physical Activity: Not on file  ?Stress: Not on file  ?Social Connections: Not on file  ?Intimate Partner Violence: Not on file  ? ?Family History  ?  Problem Relation Age of Onset  ? Cancer Mother   ? Heart failure Father   ? ?Past Surgical History:  ?Procedure Laterality Date  ? CESAREAN SECTION    ? DILATION AND CURETTAGE OF UTERUS    ? NOVASURE ABLATION    ? TUBAL LIGATION    ? WISDOM TOOTH EXTRACTION    ? ?  ?Mardella Layman, MD ?10/13/21 [phone] ? ?

## 2022-04-26 ENCOUNTER — Ambulatory Visit
Admission: EM | Admit: 2022-04-26 | Discharge: 2022-04-26 | Disposition: A | Discharge status: Home / Self Care | Payer: PRIVATE HEALTH INSURANCE | Attending: Family Medicine | Admitting: Family Medicine

## 2022-04-26 ENCOUNTER — Other Ambulatory Visit: Payer: Self-pay

## 2022-04-26 ENCOUNTER — Encounter: Payer: Self-pay | Admitting: Emergency Medicine

## 2022-04-26 DIAGNOSIS — J069 Acute upper respiratory infection, unspecified: Secondary | ICD-10-CM | POA: Insufficient documentation

## 2022-04-26 DIAGNOSIS — Z20822 Contact with and (suspected) exposure to covid-19: Secondary | ICD-10-CM | POA: Diagnosis not present

## 2022-04-26 DIAGNOSIS — J209 Acute bronchitis, unspecified: Secondary | ICD-10-CM | POA: Diagnosis not present

## 2022-04-26 DIAGNOSIS — R059 Cough, unspecified: Secondary | ICD-10-CM | POA: Insufficient documentation

## 2022-04-26 DIAGNOSIS — R062 Wheezing: Secondary | ICD-10-CM | POA: Diagnosis not present

## 2022-04-26 LAB — RESP PANEL BY RT-PCR (FLU A&B, COVID) ARPGX2
Influenza A by PCR: NEGATIVE
Influenza B by PCR: NEGATIVE
SARS Coronavirus 2 by RT PCR: NEGATIVE

## 2022-04-26 MED ORDER — BENZONATATE 100 MG PO CAPS: ORAL_CAPSULE | ORAL | 0 refills | Status: DC

## 2022-04-26 MED ORDER — ALBUTEROL SULFATE HFA 108 (90 BASE) MCG/ACT IN AERS: 2.0000 | INHALATION_SPRAY | RESPIRATORY_TRACT | 2 refills | Status: DC | PRN

## 2022-04-26 MED ORDER — PREDNISONE 20 MG PO TABS: 40.0000 mg | ORAL_TABLET | Freq: Every day | ORAL | 0 refills | Status: DC

## 2022-04-26 MED ORDER — BUDESONIDE-FORMOTEROL FUMARATE 80-4.5 MCG/ACT IN AERO: 2.0000 | INHALATION_SPRAY | Freq: Two times a day (BID) | RESPIRATORY_TRACT | 2 refills | Status: DC

## 2022-04-26 NOTE — ED Provider Notes (Signed)
[facility]    CSN: [id number] Arrival date & time: 04/26/22  0908      History   Chief Complaint Chief Complaint  Patient presents with   Nasal Congestion    HPI Andrea Daugherty is a 51 y.o. female.   Pt reports runny nose that progressed into nasal congestion, sore throat, and cough x2 days.       History reviewed. No pertinent past medical history.  Patient Active Problem List   Diagnosis Date Noted   Tennis elbow syndrome 07/11/2013   Shoulder pain 08/17/2011   Rotator cuff syndrome of right shoulder 08/17/2011    Past Surgical History:  Procedure Laterality Date   CESAREAN SECTION     DILATION AND CURETTAGE OF UTERUS     NOVASURE ABLATION     TUBAL LIGATION     WISDOM TOOTH EXTRACTION      OB History     Gravida  4   Para  3   Term  3   Preterm      AB  1   Living  3      SAB  1   IAB      Ectopic      Multiple      Live Births               Home Medications    Prior to Admission medications   Medication Sig Start Date End Date Taking? Authorizing Provider  predniSONE (DELTASONE) 20 MG tablet Take 2 tablets (40 mg total) by mouth daily with breakfast. 04/26/22  Yes Volney American, PA-C  albuterol (VENTOLIN HFA) 108 (90 Base) MCG/ACT inhaler Inhale 2 puffs into the lungs every 4 (four) hours as needed for wheezing or shortness of breath. 04/26/22   Volney American, PA-C  benzonatate (TESSALON) 100 MG capsule Take 1 capsule by mouth every 8 (eight) hours for cough. 04/26/22   Volney American, PA-C  budesonide-formoterol [facility]) 80-4.5 MCG/ACT inhaler Inhale 2 puffs into the lungs in the morning and at bedtime. For one month 04/26/22   Volney American, PA-C  Ibuprofen 200 MG CAPS Take by mouth.    [provider]  predniSONE (STERAPRED UNI-PAK 48 TAB) 10 MG (48) TBPK tablet Take as directed. 10/12/21   Vanessa Kick, MD    Family History Family History  Problem Relation Age of  Onset   Cancer Mother    Heart failure Father     Social History Social History   Tobacco Use   Smoking status: Every Day    Packs/day: 0.50    Years: 28.00    Total pack years: 14.00    Types: Cigarettes   Smokeless tobacco: Never  Substance Use Topics   Alcohol use: No   Drug use: No     Allergies   Hydrocodone   Review of Systems Review of Systems PER HPI  Physical Exam Triage Vital Signs ED Triage Vitals  Enc Vitals Group     BP 04/26/22 1102 (!) 142/89     Pulse Rate 04/26/22 1102 86     Resp 04/26/22 1102 20     Temp 04/26/22 1102 99.1 F (37.3 C)     Temp Source 04/26/22 1102 Oral     SpO2 04/26/22 1102 94 %     Weight --      Height --      Head Circumference --      Peak Flow --  Pain Score 04/26/22 1103 2     Pain Loc --      Pain Edu? --      Excl. in GC? --    No data found.  Updated Vital Signs BP (!) 142/89 (BP Location: Right Arm)   Pulse 86   Temp 99.1 F (37.3 C) (Oral)   Resp 20   LMP  (LMP Unknown)   SpO2 94%   Visual Acuity Right Eye Distance:   Left Eye Distance:   Bilateral Distance:    Right Eye Near:   Left Eye Near:    Bilateral Near:     Physical Exam Vitals and nursing note reviewed.  Constitutional:      Appearance: Normal appearance.  HENT:     Head: Atraumatic.     Right Ear: Tympanic membrane and external ear normal.     Left Ear: Tympanic membrane and external ear normal.     Nose: Rhinorrhea present.     Mouth/Throat:     Mouth: Mucous membranes are moist.     Pharynx: Posterior oropharyngeal erythema present.  Eyes:     Extraocular Movements: Extraocular movements intact.     Conjunctiva/sclera: Conjunctivae normal.  Cardiovascular:     Rate and Rhythm: Normal rate and regular rhythm.     Heart sounds: Normal heart sounds.  Pulmonary:     Effort: Pulmonary effort is normal.     Breath sounds: Wheezing present. No rales.  Musculoskeletal:        General: Normal range of motion.      Cervical back: Normal range of motion and neck supple.  Lymphadenopathy:     Cervical: No cervical adenopathy.  Skin:    General: Skin is warm and dry.  Neurological:     Mental Status: She is alert and oriented to person, place, and time.  Psychiatric:        Mood and Affect: Mood normal.        Thought Content: Thought content normal.    UC Treatments / Results  Labs (all labs ordered are listed, but only abnormal results are displayed) Labs Reviewed  RESP PANEL BY RT-PCR (FLU A&B, COVID) [id number]   EKG  Radiology No results found.  Procedures Procedures (including critical care time)  Medications Ordered in UC Medications - No data to display  Initial Impression / Assessment and Plan / UC Course  I have reviewed the triage vital signs and the nursing notes.  Pertinent labs & imaging results that were available during my care of the patient were reviewed by me and considered in my medical decision making (see chart for details).     Suspect viral upper respiratory infection causing some bronchitis.  We will refill her Symbicort and albuterol inhalers as she states she is out of both and treat with prednisone, Tessalon Perles, continued over-the-counter supportive medications and home care.  Respiratory panel pending.  Work note given.  Return for any worsening symptoms.  Final Clinical Impressions(s) / UC Diagnoses   Final diagnoses:  Viral URI with cough  Wheezing  Acute bronchitis, unspecified organism   Discharge Instructions   None    ED Prescriptions     Medication Sig Dispense Auth. Provider   benzonatate (TESSALON) 100 MG capsule Take 1 capsule by mouth every 8 (eight) hours for cough. 21 capsule Particia Nearing, [place]   budesonide-formoterol [facility] 2) 80-4.5 MCG/ACT inhaler Inhale 2 puffs into the lungs in the morning and at bedtime. For one month 1 each [place],  Salley Hews, PA-C   albuterol (VENTOLIN HFA) 108 (90 Base) MCG/ACT inhaler Inhale 2  puffs into the lungs every 4 (four) hours as needed for wheezing or shortness of breath. 18 g Particia Nearing, [place]   predniSONE (DELTASONE) 20 MG tablet Take 2 tablets (40 mg total) by mouth daily with breakfast. 10 tablet Particia Nearing, [place]      PDMP not reviewed this encounter.   Particia Nearing, [place] 04/26/22 1206

## 2022-04-26 NOTE — ED Triage Notes (Signed)
Pt reports runny nose that progressed into nasal congestion, sore throat, and cough x2 days.

## 2022-08-03 ENCOUNTER — Encounter: Payer: Self-pay | Admitting: Emergency Medicine

## 2022-08-03 ENCOUNTER — Ambulatory Visit
Admission: EM | Admit: 2022-08-03 | Discharge: 2022-08-03 | Disposition: A | Discharge status: Home / Self Care | Payer: BC Managed Care – PPO | Attending: Nurse Practitioner | Admitting: Nurse Practitioner

## 2022-08-03 ENCOUNTER — Other Ambulatory Visit: Payer: Self-pay

## 2022-08-03 DIAGNOSIS — R062 Wheezing: Secondary | ICD-10-CM | POA: Diagnosis not present

## 2022-08-03 DIAGNOSIS — J209 Acute bronchitis, unspecified: Secondary | ICD-10-CM

## 2022-08-03 MED ORDER — IPRATROPIUM-ALBUTEROL 0.5-2.5 (3) MG/3ML IN SOLN
3.0000 mL | Freq: Once | RESPIRATORY_TRACT | Status: AC
  Administered 2022-08-03: 3 mL via RESPIRATORY_TRACT

## 2022-08-03 MED ORDER — BENZONATATE 100 MG PO CAPS: 100.0000 mg | ORAL_CAPSULE | Freq: Three times a day (TID) | ORAL | 0 refills | Status: DC | PRN

## 2022-08-03 MED ORDER — PREDNISONE 20 MG PO TABS: 40.0000 mg | ORAL_TABLET | Freq: Every day | ORAL | 0 refills | Status: AC

## 2022-08-03 MED ORDER — METHYLPREDNISOLONE SODIUM SUCC 125 MG IJ SOLR
60.0000 mg | Freq: Once | INTRAMUSCULAR | Status: AC
  Administered 2022-08-03: 60 mg via INTRAMUSCULAR

## 2022-08-03 MED ORDER — BUDESONIDE-FORMOTEROL FUMARATE 80-4.5 MCG/ACT IN AERO
2.0000 | INHALATION_SPRAY | Freq: Two times a day (BID) | RESPIRATORY_TRACT | 2 refills | Status: DC
Start: 2022-08-03 — End: 2022-10-28

## 2022-08-03 NOTE — ED Triage Notes (Signed)
Pt reports cough since Sunday. Pt denies any sputum and reports "thick enough to irritate but not come out." Denies any fever but reports intermittent headache.

## 2022-08-03 NOTE — ED Provider Notes (Signed)
[facility]    CSN: [id number] Arrival date & time: 08/03/22  1154      History   Chief Complaint Chief Complaint  Patient presents with   Cough    HPI Andrea Daugherty is a 52 y.o. female.   Patient presents today for 3 days of congested feeling cough with nothing coming up, shortness of breath, wheezing, chest congestion, runny nose, postnasal drainage, sore throat and headache, decreased appetite, and fatigue.  She denies known fevers, chest pain or tightness, nasal congestion, ear pain, abdominal pain, nausea/vomiting, diarrhea, loss of taste or smell, and new rash.  Reports she has a history of bronchitis and thinks it has "come back."  Has been using albuterol inhaler which has not helped much, Mucinex, and ibuprofen/Tylenol as needed for headache with minimal benefit.  Patient denies history of chronic lung disease.  No history of asthma.  Reports she does currently smoke and is working on quitting.  Last seen in urgent care for similar symptoms in October 2023, was given prednisone, albuterol rescue inhaler, and started on Symbicort for her breathing.  Reports she ran out of Symbicort 2 weeks ago.      History reviewed. No pertinent past medical history.  Patient Active Problem List   Diagnosis Date Noted   Tennis elbow syndrome 07/11/2013   Shoulder pain 08/17/2011   Rotator cuff syndrome of right shoulder 08/17/2011    Past Surgical History:  Procedure Laterality Date   CESAREAN SECTION     DILATION AND CURETTAGE OF UTERUS     NOVASURE ABLATION     TUBAL LIGATION     WISDOM TOOTH EXTRACTION      OB History     Gravida  4   Para  3   Term  3   Preterm      AB  1   Living  3      SAB  1   IAB      Ectopic      Multiple      Live Births               Home Medications    Prior to Admission medications   Medication Sig Start Date End Date Taking? Authorizing Provider  albuterol (VENTOLIN HFA) 108 (90 Base) MCG/ACT  inhaler Inhale 2 puffs into the lungs every 4 (four) hours as needed for wheezing or shortness of breath. 04/26/22   Volney American, PA-C  benzonatate (TESSALON) 100 MG capsule Take 1 capsule (100 mg total) by mouth 3 (three) times daily as needed for cough. Do not take with alcohol or while driving or operating heavy machinery.  May cause drowsiness. 08/03/22   Eulogio Bear, NP  budesonide-formoterol (SYMBICORT) 80-4.5 MCG/ACT inhaler Inhale 2 puffs into the lungs in the morning and at bedtime. Rinse mouth out after each use 08/03/22   Eulogio Bear, NP  Ibuprofen 200 MG CAPS Take by mouth.    [provider]  predniSONE (DELTASONE) 20 MG tablet Take 2 tablets (40 mg total) by mouth daily with breakfast for 5 days. 08/03/22 08/08/22  Eulogio Bear, NP    Family History Family History  Problem Relation Age of Onset   Cancer Mother    Heart failure Father     Social History Social History   Tobacco Use   Smoking status: Every Day    Packs/day: 0.50    Years: 28.00    Total pack years: 14.00  Types: Cigarettes   Smokeless tobacco: Never  Substance Use Topics   Alcohol use: No   Drug use: No     Allergies   Hydrocodone   Review of Systems Review of Systems Per HPI  Physical Exam Triage Vital Signs ED Triage Vitals  Enc Vitals Group     BP 08/03/22 1340 125/82     Pulse Rate 08/03/22 1340 79     Resp 08/03/22 1340 20     Temp 08/03/22 1340 98 F (36.7 C)     Temp Source 08/03/22 1340 Oral     SpO2 08/03/22 1340 93 %     Weight --      Height --      Head Circumference --      Peak Flow --      Pain Score 08/03/22 1339 4     Pain Loc --      Pain Edu? --      Excl. in [place]? --    No data found.  Updated Vital Signs BP 125/82 (BP Location: Right Arm)   Pulse 79   Temp 98 F (36.7 C) (Oral)   Resp 20   LMP  (LMP Unknown)   SpO2 93%   Visual Acuity Right Eye Distance:   Left Eye Distance:   Bilateral Distance:    Right  Eye Near:   Left Eye Near:    Bilateral Near:     Physical Exam Vitals and nursing note reviewed.  Constitutional:      General: She is not in acute distress.    Appearance: Normal appearance. She is not ill-appearing or toxic-appearing.  HENT:     Head: Normocephalic and atraumatic.     Right Ear: Tympanic membrane, ear canal and external ear normal.     Left Ear: Tympanic membrane, ear canal and external ear normal.     Nose: No congestion or rhinorrhea.     Mouth/Throat:     Mouth: Mucous membranes are moist.     Pharynx: Oropharynx is clear. Posterior oropharyngeal erythema present. No oropharyngeal exudate.  Eyes:     General: No scleral icterus.    Extraocular Movements: Extraocular movements intact.  Cardiovascular:     Rate and Rhythm: Normal rate and regular rhythm.     Heart sounds: Normal heart sounds. No murmur heard. Pulmonary:     Effort: Pulmonary effort is normal. No respiratory distress.     Breath sounds: Wheezing present. No rhonchi or rales.     Comments: Patient talking in complete sentences without accessory muscle use.  Patient breathing with mouth closed when not speaking. Abdominal:     General: Abdomen is flat. Bowel sounds are normal. There is no distension.     Palpations: Abdomen is soft.     Tenderness: There is no abdominal tenderness.  Musculoskeletal:     Cervical back: Normal range of motion and neck supple.  Lymphadenopathy:     Cervical: No cervical adenopathy.  Skin:    General: Skin is warm and dry.     Coloration: Skin is not jaundiced or pale.     Findings: No erythema or rash.  Neurological:     Mental Status: She is alert and oriented to person, place, and time.  Psychiatric:        Behavior: Behavior is cooperative.      UC Treatments / Results  Labs (all labs ordered are listed, but only abnormal results are displayed) Labs Reviewed - No data to display  EKG   Radiology No results found.  Procedures Procedures  (including critical care time)  Medications Ordered in UC Medications  methylPREDNISolone sodium succinate (SOLU-MEDROL) 125 mg/2 mL injection 60 mg (60 mg Intramuscular Given 08/03/22 1408)  ipratropium-albuterol (DUONEB) 0.5-2.5 (3) MG/3ML nebulizer solution 3 mL (3 mLs Nebulization Given 08/03/22 1411)    Initial Impression / Assessment and Plan / UC Course  I have reviewed the triage vital signs and the nursing notes.  Pertinent labs & imaging results that were available during my care of the patient were reviewed by me and considered in my medical decision making (see chart for details).   Patient is well-appearing, normotensive, afebrile, not tachycardic, not tachypneic, oxygenating well on room air.    Wheezing Acute bronchitis, unspecified organism Suspect secondary to running out of Symbicort, highly suspicious for underlying chronic pulmonary disease Duoneb given in urgent care today with increased air movement to bilateral lung fields Also gave IM injection of Solu-Medrol 60 mg today for lung inflammation Start oral prednisone tomorrow Continue albuterol inhaler every 4-6 hours as needed at home Recommended pulmonary hygiene; start Jerilynn Som, supportive care discussed Recommended close follow-up with primary care provider regarding recurrent wheezing/bronchitis   The patient was given the opportunity to ask questions.  All questions answered to their satisfaction.  The patient is in agreement to this plan.    Final Clinical Impressions(s) / UC Diagnoses   Final diagnoses:  Wheezing  Acute bronchitis, unspecified organism     Discharge Instructions      We have given you a DuoNeb breathing treatment and a shot of steroid medication today to help with the wheezing in your chest from the bronchitis.  Please continue albuterol inhaler at home every 4-6 hours as needed for wheezing or shortness of breath.  Start the oral prednisone tomorrow.  Please resume the  Symbicort inhaler and take it as prescribed.  You can use the [employer] as needed for dry cough.     ED Prescriptions     Medication Sig Dispense Auth. Provider   budesonide-formoterol (SYMBICORT) 80-4.5 MCG/ACT inhaler Inhale 2 puffs into the lungs in the morning and at bedtime. Rinse mouth out after each use 1 each Cathlean Marseilles A, NP   benzonatate (TESSALON) 100 MG capsule Take 1 capsule (100 mg total) by mouth 3 (three) times daily as needed for cough. Do not take with alcohol or while driving or operating heavy machinery.  May cause drowsiness. 30 capsule Cathlean Marseilles A, NP   predniSONE (DELTASONE) 20 MG tablet Take 2 tablets (40 mg total) by mouth daily with breakfast for 5 days. 10 tablet Valentino Nose, NP      PDMP not reviewed this encounter.   Valentino Nose, NP 08/03/22 1510

## 2022-08-03 NOTE — Discharge Instructions (Addendum)
We have given you a DuoNeb breathing treatment and a shot of steroid medication today to help with the wheezing in your chest from the bronchitis.  Please continue albuterol inhaler at home every 4-6 hours as needed for wheezing or shortness of breath.  Start the oral prednisone tomorrow.  Please resume the Symbicort inhaler and take it as prescribed.  You can use the [employer] as needed for dry cough.

## 2022-09-07 DIAGNOSIS — Z6825 Body mass index (BMI) 25.0-25.9, adult: Secondary | ICD-10-CM | POA: Diagnosis not present

## 2022-09-07 DIAGNOSIS — R030 Elevated blood-pressure reading, without diagnosis of hypertension: Secondary | ICD-10-CM | POA: Diagnosis not present

## 2022-09-07 DIAGNOSIS — U071 COVID-19: Secondary | ICD-10-CM | POA: Diagnosis not present

## 2022-09-07 DIAGNOSIS — E663 Overweight: Secondary | ICD-10-CM | POA: Diagnosis not present

## 2022-09-07 DIAGNOSIS — R059 Cough, unspecified: Secondary | ICD-10-CM | POA: Diagnosis not present

## 2022-09-14 DIAGNOSIS — Z6825 Body mass index (BMI) 25.0-25.9, adult: Secondary | ICD-10-CM | POA: Diagnosis not present

## 2022-09-14 DIAGNOSIS — H109 Unspecified conjunctivitis: Secondary | ICD-10-CM | POA: Diagnosis not present

## 2022-09-14 DIAGNOSIS — J40 Bronchitis, not specified as acute or chronic: Secondary | ICD-10-CM | POA: Diagnosis not present

## 2022-09-14 DIAGNOSIS — B342 Coronavirus infection, unspecified: Secondary | ICD-10-CM | POA: Diagnosis not present

## 2022-10-28 ENCOUNTER — Ambulatory Visit
Admission: EM | Admit: 2022-10-28 | Discharge: 2022-10-28 | Disposition: A | Discharge status: Home / Self Care | Payer: BC Managed Care – PPO | Attending: Physician Assistant | Admitting: Physician Assistant

## 2022-10-28 DIAGNOSIS — J441 Chronic obstructive pulmonary disease with (acute) exacerbation: Secondary | ICD-10-CM | POA: Diagnosis not present

## 2022-10-28 DIAGNOSIS — T148XXA Other injury of unspecified body region, initial encounter: Secondary | ICD-10-CM

## 2022-10-28 MED ORDER — ALBUTEROL SULFATE HFA 108 (90 BASE) MCG/ACT IN AERS: 2.0000 | INHALATION_SPRAY | RESPIRATORY_TRACT | 2 refills | Status: DC | PRN

## 2022-10-28 MED ORDER — PREDNISONE 20 MG PO TABS: 40.0000 mg | ORAL_TABLET | Freq: Every day | ORAL | 0 refills | Status: AC

## 2022-10-28 MED ORDER — IPRATROPIUM-ALBUTEROL 0.5-2.5 (3) MG/3ML IN SOLN
3.0000 mL | Freq: Once | RESPIRATORY_TRACT | Status: AC
  Administered 2022-10-28: 3 mL via RESPIRATORY_TRACT

## 2022-10-28 MED ORDER — LEVOCETIRIZINE DIHYDROCHLORIDE 5 MG PO TABS: 5.0000 mg | ORAL_TABLET | Freq: Every evening | ORAL | 1 refills | Status: DC

## 2022-10-28 MED ORDER — BUDESONIDE-FORMOTEROL FUMARATE 80-4.5 MCG/ACT IN AERO: 2.0000 | INHALATION_SPRAY | Freq: Two times a day (BID) | RESPIRATORY_TRACT | 2 refills | Status: DC

## 2022-10-28 NOTE — ED Provider Notes (Signed)
[facility]    CSN: [id number] Arrival date & time: 10/28/22  1357      History   Chief Complaint No chief complaint on file.   HPI Andrea Daugherty is a 52 y.o. female.   Patient presents today with a 2-day history of URI symptoms.  She reports some congestion, cough, wheezing.  She does have a history of chronic bronchitis previously managed with Symbicort but has been without this medication as insurance refused to cover it.  She does not have her albuterol inhaler.  She reports that she was treated for similar illness several months ago but has not had any steroids or antibiotics in the past 30 days.  She is not taking any over-the-counter medications.  She believes that seasonal allergies triggered her symptoms.  She has not tried taking any antihistamines as they generally are not effective.  She is a current everyday smoker.  Denies any history of diabetes or immunosuppression.  In addition, she reports a several year history of persistent wound on her left lower leg.  She generally has been managing this with lotion and allowing it to heal but it has never completely healed.  She denies any history of peripheral artery disease or venous insufficiency.  She has never seen a specialist.  She is not currently followed by primary care.    History reviewed. No pertinent past medical history.  Patient Active Problem List   Diagnosis Date Noted   Tennis elbow syndrome 07/11/2013   Shoulder pain 08/17/2011   Rotator cuff syndrome of right shoulder 08/17/2011    Past Surgical History:  Procedure Laterality Date   CESAREAN SECTION     DILATION AND CURETTAGE OF UTERUS     NOVASURE ABLATION     TUBAL LIGATION     WISDOM TOOTH EXTRACTION      OB History     Gravida  4   Para  3   Term  3   Preterm      AB  1   Living  3      SAB  1   IAB      Ectopic      Multiple      Live Births               Home Medications    Prior to Admission  medications   Medication Sig Start Date End Date Taking? Authorizing Provider  levocetirizine (XYZAL ALLERGY 24HR) 5 MG tablet Take 1 tablet (5 mg total) by mouth every evening. 10/28/22  Yes Litzi Binning K, PA-C  predniSONE (DELTASONE) 20 MG tablet Take 2 tablets (40 mg total) by mouth daily for 5 days. 10/28/22 11/02/22 Yes Akira Perusse K, PA-C  albuterol (VENTOLIN HFA) 108 (90 Base) MCG/ACT inhaler Inhale 2 puffs into the lungs every 4 (four) hours as needed for wheezing or shortness of breath. 10/28/22   Tawna Alwin, Noberto Retort, PA-C  budesonide-formoterol (SYMBICORT) 80-4.5 MCG/ACT inhaler Inhale 2 puffs into the lungs in the morning and at bedtime. Rinse mouth out after each use 10/28/22   Hilario Robarts K, PA-C  Ibuprofen 200 MG CAPS Take by mouth.    [provider]    Family History Family History  Problem Relation Age of Onset   Cancer Mother    Heart failure Father     Social History Social History   Tobacco Use   Smoking status: Every Day    Packs/day: 0.50    Years: 28.00  Additional pack years: 0.00    Total pack years: 14.00    Types: Cigarettes   Smokeless tobacco: Never  Substance Use Topics   Alcohol use: No   Drug use: No     Allergies   Hydrocodone   Review of Systems Review of Systems  Constitutional:  Positive for activity change. Negative for appetite change, fatigue and fever.  HENT:  Positive for congestion. Negative for sinus pressure, sneezing and sore throat.   Respiratory:  Positive for cough, shortness of breath and wheezing. Negative for chest tightness.   Cardiovascular:  Negative for chest pain.  Gastrointestinal:  Negative for abdominal pain, diarrhea, nausea and vomiting.  Skin:  Positive for wound.     Physical Exam Triage Vital Signs ED Triage Vitals  Enc Vitals Group     BP 10/28/22 1427 115/77     Pulse Rate 10/28/22 1427 96     Resp 10/28/22 1427 20     Temp 10/28/22 1427 98.2 F (36.8 C)     Temp Source 10/28/22 1427 Oral      SpO2 10/28/22 1427 96 %     Weight --      Height --      Head Circumference --      Peak Flow --      Pain Score 10/28/22 1431 0     Pain Loc --      Pain Edu? --      Excl. in [place]? --    No data found.  Updated Vital Signs BP 115/77 (BP Location: Right Arm)   Pulse 96   Temp 98.2 F (36.8 C) (Oral)   Resp 20   LMP  (LMP Unknown)   SpO2 96%   Visual Acuity Right Eye Distance:   Left Eye Distance:   Bilateral Distance:    Right Eye Near:   Left Eye Near:    Bilateral Near:     Physical Exam Vitals reviewed.  Constitutional:      General: She is awake. She is not in acute distress.    Appearance: Normal appearance. She is well-developed. She is not ill-appearing.     Comments: Very pleasant female presented age in no acute distress sitting comfortably in exam room  HENT:     Head: Normocephalic and atraumatic.     Right Ear: Tympanic membrane, ear canal and external ear normal. Tympanic membrane is not erythematous or bulging.     Left Ear: Tympanic membrane, ear canal and external ear normal. Tympanic membrane is not erythematous or bulging.     Nose:     Right Sinus: No maxillary sinus tenderness or frontal sinus tenderness.     Left Sinus: No maxillary sinus tenderness or frontal sinus tenderness.     Mouth/Throat:     Pharynx: Uvula midline. No oropharyngeal exudate or posterior oropharyngeal erythema.  Cardiovascular:     Rate and Rhythm: Normal rate and regular rhythm.     Heart sounds: Normal heart sounds, S1 normal and S2 normal. No murmur heard. Pulmonary:     Effort: Pulmonary effort is normal.     Breath sounds: Examination of the right-lower field reveals decreased breath sounds. Examination of the left-lower field reveals decreased breath sounds. Decreased breath sounds present. No wheezing, rhonchi or rales.  Skin:    Findings: Wound present.       Psychiatric:        Behavior: Behavior is cooperative.      UC Treatments / Results  Labs (  all  labs ordered are listed, but only abnormal results are displayed) Labs Reviewed - No data to display  EKG   Radiology No results found.  Procedures Procedures (including critical care time)  Medications Ordered in UC Medications  ipratropium-albuterol (DUONEB) 0.5-2.5 (3) MG/3ML nebulizer solution 3 mL (3 mLs Nebulization Given 10/28/22 1447)    Initial Impression / Assessment and Plan / UC Course  I have reviewed the triage vital signs and the nursing notes.  Pertinent labs & imaging results that were available during my care of the patient were reviewed by me and considered in my medical decision making (see chart for details).     Patient is well-appearing, afebrile, nontoxic, nontachycardic.  No evidence of acute infection on physical exam that warrant initiation of antibiotics.  Given clinical presentation concern for chronic bronchitis exacerbation.  Chest x-ray was deferred as she had no significant rales or rhonchi and oxygen saturation was 96%.  She was given a DuoNeb with significant improvement of symptoms.  Refill of albuterol was sent to pharmacy with instruction to use this every 4-6 hours as needed.  Symbicort was also sent to the pharmacy for maintenance and we discussed that she should rinse her mouth following use of this medication.  If she has any difficulty getting this medication she is to contact us.  Will start prednisone burst of 40 mg for 5 days.  Antibiotics were deferred as she has an increasing cough but denies additional COPD symptoms that would warrant initiation of antibiotics.  Discussed that she is not to take NSAIDs with this medication due to risk of GI bleeding.  Recommended she use over-the-counter medications for additional symptom relief and will certainly with cetirizine to help manage congestion symptoms.  She does not currently have a primary care provider so we will try to establish her with someone via PCP assistance.  Discussed that if her symptoms  do not improving or if anything worsens she should return for reevaluation.  Strict return precautions given.  No evidence of infection on physical exam that warrant initiation of antibiotics.  I discussed my concern for venous insufficiency versus PAD as contributing to nonhealing wound.  Recommended that she follow-up with wound care and was given contact information for local provider.  Discussed that if she has any signs of infection she should return for reevaluation.  Final Clinical Impressions(s) / UC Diagnoses   Final diagnoses:  Bronchitis, chronic obstructive, with exacerbation  Chronic wound     Discharge Instructions      Your lung sound better after the breathing treatment.  I have sent in a refill of your albuterol.  Use this every 4-6 hours as needed.  Start Symbicort twice daily.  Rinse your mouth following use of this medication.  I also recommend that you start levocetirizine.  Take prednisone 40 mg for 5 days.  Do not take NSAIDs with this medication including aspirin, ibuprofen/Advil, naproxen/Aleve.  You can use Tylenol/acetaminophen for additional symptom relief.  Follow-up with primary care once you are established with them.  Someone should call you to help schedule an appointment.  If anything changes or worsens please return for reevaluation.  Please call and schedule an appointment with wound care about the chronic wound on your leg.  If you develop any redness, drainage, swelling, increased pain you should return for reevaluation to rule out infection.     ED Prescriptions     Medication Sig Dispense Auth. Provider   albuterol (VENTOLIN HFA) 108 (90  Base) MCG/ACT inhaler Inhale 2 puffs into the lungs every 4 (four) hours as needed for wheezing or shortness of breath. 18 g Daleisa Halperin K, PA-C   budesonide-formoterol (SYMBICORT) 80-4.5 MCG/ACT inhaler Inhale 2 puffs into the lungs in the morning and at bedtime. Rinse mouth out after each use 1 each Maylene Crocker K,  PA-C   levocetirizine (XYZAL ALLERGY 24HR) 5 MG tablet Take 1 tablet (5 mg total) by mouth every evening. 30 tablet Rowene Suto K, PA-C   predniSONE (DELTASONE) 20 MG tablet Take 2 tablets (40 mg total) by mouth daily for 5 days. 10 tablet Loralai Eisman, Noberto RetortErin K, PA-C      PDMP not reviewed this encounter.   Jeani HawkingRaspet, Jovann Luse K, PA-C 10/28/22 1514

## 2022-10-28 NOTE — ED Triage Notes (Signed)
Pt reports she thinks she has bronchitis. She was wheezing and has chest congestion x 2 days. Pt states she is out of her inhalers.

## 2022-10-28 NOTE — Discharge Instructions (Signed)
Your lung sound better after the breathing treatment.  I have sent in a refill of your albuterol.  Use this every 4-6 hours as needed.  Start Symbicort twice daily.  Rinse your mouth following use of this medication.  I also recommend that you start levocetirizine.  Take prednisone 40 mg for 5 days.  Do not take NSAIDs with this medication including aspirin, ibuprofen/Advil, naproxen/Aleve.  You can use Tylenol/acetaminophen for additional symptom relief.  Follow-up with primary care once you are established with them.  Someone should call you to help schedule an appointment.  If anything changes or worsens please return for reevaluation.  Please call and schedule an appointment with wound care about the chronic wound on your leg.  If you develop any redness, drainage, swelling, increased pain you should return for reevaluation to rule out infection.

## 2022-11-14 ENCOUNTER — Encounter (HOSPITAL_BASED_OUTPATIENT_CLINIC_OR_DEPARTMENT_OTHER): Payer: BC Managed Care – PPO | Attending: General Surgery | Admitting: General Surgery

## 2022-11-14 DIAGNOSIS — S81802A Unspecified open wound, left lower leg, initial encounter: Secondary | ICD-10-CM | POA: Diagnosis not present

## 2022-11-14 DIAGNOSIS — F17210 Nicotine dependence, cigarettes, uncomplicated: Secondary | ICD-10-CM | POA: Diagnosis not present

## 2022-11-14 DIAGNOSIS — J42 Unspecified chronic bronchitis: Secondary | ICD-10-CM | POA: Diagnosis not present

## 2022-11-14 DIAGNOSIS — R21 Rash and other nonspecific skin eruption: Secondary | ICD-10-CM | POA: Insufficient documentation

## 2022-11-14 NOTE — Progress Notes (Addendum)
ICELA, GLYMPH ([id number]) [id number] Page 1 of 6 Visit Report for 11/14/2022 Chief Complaint Document Details Patient Name: Date of Service: Andrea Daugherty, Andrea Daugherty 11/14/2022 8:00 A M Medical Record Number: [id number] Patient Account Number: [id number] Date of Birth/Sex: Treating RN: Apr 22, 1971 (52 y.o. F) Primary Care Provider: Melanee Spry NT Other Clinician: Referring Provider: Treating Provider/Extender: Jacqulyn Ducking, BEL[place] NT Weeks in Treatment: 0 Information Obtained from: Patient Chief Complaint Patient presents to the wound care center due with non-wound condition(s) Electronic Signature(s) Signed: 11/14/2022 9:08:15 AM By: Duanne Guess MD FACS Previous Signature: 11/14/2022 8:21:36 AM Version By: Duanne Guess MD FACS Entered By: Duanne Guess on 11/14/2022 09:08:15 -------------------------------------------------------------------------------- HPI Details Patient Name: Date of Service: Andrea Hough RO N Daugherty. 11/14/2022 8:00 A M Medical Record Number: [id number] Patient Account Number: [id number] Date of Birth/Sex: Treating RN: 1970/08/05 (52 y.o. F) Primary Care Provider: Melanee Spry NT Other Clinician: Referring Provider: Treating Provider/Extender: Duanne Guess [facility], [place] NT Weeks in Treatment: 0 History of Present Illness HPI Description: ADMISSION 11/14/2022 This is a 52 year old woman without much known medical history. She was recently seen in an urgent care facility for wheezing and dyspnea. During treatment for this, she reported a chronic wound on her left lower leg. It has apparently been present for several years. She apparently was simply applying lotion to the site. It apparently has waxed and waned somewhat, but it never actually healed. She denied any history of venous insufficiency or peripheral artery disease. She does not have a primary care provider and has never seen a specialist. She was referred to the wound  care center from the urgent care for further evaluation and management. On exam, she actually does not have an open wound. She has a rash on her lower leg. She says that it will dry up and then she will apply lotion to it and she will develop small pustules that open and drain. Currently, she has an oval area of crusty skin with small punctate areas that have scabbed over. Electronic Signature(s) Signed: 11/14/2022 9:09:32 AM By: Duanne Guess MD FACS Previous Signature: 11/14/2022 8:24:07 AM Version By: Duanne Guess MD FACS Entered By: Duanne Guess on 11/14/2022 09:09:32 -------------------------------------------------------------------------------- Physical Exam Details Patient Name: Date of Service: Andrea Hough RO N Daugherty. 11/14/2022 8:00 A M Medical Record Number: [id number] Patient Account Number: [id number] Date of Birth/Sex: Treating RN: 02-04-1971 (52 y.o. F) Primary Care Provider: Melanee Spry NT Other Clinician: Referring Provider: Treating Provider/Extender: Duanne Guess [facility], [place] NT Weeks in Treatment: 0 Constitutional . . . . No acute distress. Respiratory Normal work of breathing on room air. Andrea Daugherty, Andrea Daugherty ([id number]) [id number] Page 2 of 6 Notes She does not have an open wound. She has what appears to be a rash on her lower leg. Electronic Signature(s) Signed: 11/14/2022 9:10:39 AM By: Duanne Guess MD FACS Entered By: Duanne Guess on 11/14/2022 09:10:39 -------------------------------------------------------------------------------- Physician Orders Details Patient Name: Date of Service: Andrea Hough RO N Daugherty. 11/14/2022 8:00 A M Medical Record Number: [id number] Patient Account Number: [id number] Date of Birth/Sex: Treating RN: [birth year] (52 y.o. Orville Govern Primary Care Provider: Melanee Spry NT Other Clinician: Referring Provider: Treating Provider/Extender: Duanne Guess [facility], [place] NT Weeks in Treatment:  0 Verbal / Phone Orders: No Diagnosis Coding ICD-10 Coding Code Description R21 Rash and other nonspecific skin eruption J42 Unspecified chronic bronchitis Follow-up Appointments Other: - Referring to Dermatology for further evaluation. If you need wound care in the future, please call us. Anesthetic (In clinic)  Topical Lidocaine 5% applied to wound bed Consults Dermatology - Referral for further evaluation of rash to left lower leg Patient Medications llergies: hydrocodone A Notifications Medication Indication Start End 11/14/2022 lidocaine DOSE topical 5 % ointment - ointment topical once daily 11/14/2022 mupirocin DOSE topical 2 % ointment - Apply thin layer to affected skin once per day 11/14/2022 clobetasol DOSE topical 0.025 % cream - Apply thin layer to affected skin once per day 11/14/2022 clobetasol DOSE topical 0.025 % cream - Apply thin layer to affected area daily Electronic Signature(s) Signed: 11/14/2022 3:25:55 PM By: Duanne Guess MD FACS Previous Signature: 11/14/2022 3:24:03 PM Version By: Duanne Guess MD FACS Previous Signature: 11/14/2022 10:09:23 AM Version By: Duanne Guess MD FACS Previous Signature: 11/14/2022 9:12:12 AM Version By: Duanne Guess MD FACS Entered By: Duanne Guess on 11/14/2022 15:25:55 -------------------------------------------------------------------------------- Problem List Details Patient Name: Date of Service: Andrea Hough RO N Daugherty. 11/14/2022 8:00 A M Medical Record Number: [id number] Patient Account Number: [id number] Date of Birth/Sex: Treating RN: [birth year] (52 y.o. F) Primary Care Provider: Melanee Spry NT Other Clinician: Referring Provider: Treating Provider/Extender: Jacqulyn Ducking, BELMO NT Weeks in Treatment: 0 Active Problems ICD-10 Andrea Daugherty, Andrea Daugherty ([id number]) [id number] Page 3 of 6 Encounter Code Description Active Date MDM Diagnosis R21 Rash and other  nonspecific skin eruption 11/14/2022 No Yes J42 Unspecified chronic bronchitis 11/14/2022 No Yes Inactive Problems Resolved Problems Electronic Signature(s) Signed: 11/14/2022 9:07:59 AM By: Duanne Guess MD FACS Previous Signature: 11/14/2022 8:21:20 AM Version By: Duanne Guess MD FACS Previous Signature: 11/14/2022 8:20:14 AM Version By: Duanne Guess MD FACS Entered By: Duanne Guess on 11/14/2022 09:07:59 -------------------------------------------------------------------------------- Progress Note Details Patient Name: Date of Service: Andrea Hough RO N Daugherty. 11/14/2022 8:00 A M Medical Record Number: [id number] Patient Account Number: [id number] Date of Birth/Sex: Treating RN: 05-09-1971 (52 y.o. F) Primary Care Provider: Melanee Spry NT Other Clinician: Referring Provider: Treating Provider/Extender: Jacqulyn Ducking, BELMO NT Weeks in Treatment: 0 Subjective Chief Complaint Information obtained from Patient Patient presents to the wound care center due with non-wound condition(s) History of Present Illness (HPI) ADMISSION 11/14/2022 This is a 52 year old woman without much known medical history. She was recently seen in an urgent care facility for wheezing and dyspnea. During treatment for this, she reported a chronic wound on her left lower leg. It has apparently been present for several years. She apparently was simply applying lotion to the site. It apparently has waxed and waned somewhat, but it never actually healed. She denied any history of venous insufficiency or peripheral artery disease. She does not have a primary care provider and has never seen a specialist. She was referred to the wound care center from the urgent care for further evaluation and management. On exam, she actually does not have an open wound. She has a rash on her lower leg. She says that it will dry up and then she will apply lotion to it and she will develop small pustules that open and  drain. Currently, she has an oval area of crusty skin with small punctate areas that have scabbed over. Patient History Information obtained from Patient. Allergies hydrocodone (Severity: Mild, Reaction: rash) Family History Cancer - Mother, Heart Disease - Father, Hypertension - Father, Lung Disease - Father, Stroke - Siblings, No family history of Diabetes, Kidney Disease, Seizures, Thyroid Problems, Tuberculosis. Social History Current every day smoker - half pack day, Marital Status - Married, Alcohol Use - Never, Drug Use - No History, Caffeine Use - Daily. Medical  History Gastrointestinal Patient has history of Colitis Medical A Surgical History Notes nd Respiratory chronic bronchitis Musculoskeletal bursitis R shoulder Review of Systems (ROS) Constitutional Symptoms (General Health) Denies complaints or symptoms of Fatigue, Fever, Chills, Marked Weight Change. Eyes Denies complaints or symptoms of Dry Eyes, Vision Changes, Glasses / Contacts. Ear/Nose/Mouth/Throat Denies complaints or symptoms of Chronic sinus problems or rhinitis. Andrea Daugherty, Andrea Daugherty ([id number]) [id number] Page 4 of 6 Cardiovascular Denies complaints or symptoms of Chest pain. Endocrine Denies complaints or symptoms of Heat/cold intolerance. Genitourinary Denies complaints or symptoms of Frequent urination. Integumentary (Skin) Complains or has symptoms of Wounds - left leg. Musculoskeletal Denies complaints or symptoms of Muscle Pain, Muscle Weakness. Neurologic Denies complaints or symptoms of Numbness/parasthesias. Psychiatric Denies complaints or symptoms of Claustrophobia. Objective Constitutional No acute distress. Vitals Time Taken: 8:13 AM, Height: 61 in, Weight: 133 lbs, BMI: 25.1, Temperature: 98.0 F, Pulse: 72 bpm, Respiratory Rate: 20 breaths/min, Blood Pressure: 138/89 mmHg. Respiratory Normal work of breathing on room air. General Notes: She does not  have an open wound. She has what appears to be a rash on her lower leg. Assessment Active Problems ICD-10 Rash and other nonspecific skin eruption Unspecified chronic bronchitis Plan Follow-up Appointments: Other: - Referring to Dermatology for further evaluation. If you need wound care in the future, please call us. Anesthetic: (In clinic) Topical Lidocaine 5% applied to wound bed Consults ordered were: Dermatology - Referral for further evaluation of rash to left lower leg The following medication(s) was prescribed: lidocaine topical 5 % ointment ointment topical once daily was prescribed at facility mupirocin topical 2 % ointment Apply thin layer to affected skin once per day starting 11/14/2022 clobetasol topical 0.025 % cream Apply thin layer to affected skin once per day starting 11/14/2022 clobetasol topical 0.025 % cream Apply thin layer to affected area daily starting 11/14/2022 11/14/2022: This is a 52 year old woman who was referred to [place] from urgent care for evaluation of a wound on her left lower leg. She actually does not have a wound; she has a rash and would benefit from referral to dermatology. We will place that referral for her. In the meantime, I have prescribed topical mupirocin and clobetasol for her to address the rash while she waits for her dermatology appointment. She does not need the services of the wound care center and we will see her on an as-needed basis. Electronic Signature(s) Signed: 11/14/2022 3:26:15 PM By: Duanne Guess MD FACS Previous Signature: 11/14/2022 10:09:34 AM Version By: Duanne Guess MD FACS Previous Signature: 11/14/2022 9:13:30 AM Version By: Duanne Guess MD FACS Entered By: Duanne Guess on 11/14/2022 15:26:15 Andrea Daugherty, Andrea Daugherty ([id number]) [id number] Page 5 of 6 -------------------------------------------------------------------------------- HxROS Details Patient Name: Date of Service: Andrea Daugherty, Andrea Daugherty 11/14/2022 8:00 A M Medical Record Number: [id number] Patient Account Number: [id number] Date of Birth/Sex: Treating RN: Jul 17, 1971 (52 y.o. Orville Govern Primary Care Provider: Melanee Spry NT Other Clinician: Referring Provider: Treating Provider/Extender: Duanne Guess [facility], [place] NT Weeks in Treatment: 0 Information Obtained From Patient Constitutional Symptoms (General Health) Complaints and Symptoms: Negative for: Fatigue; Fever; Chills; Marked Weight Change Eyes Complaints and Symptoms: Negative for: Dry Eyes; Vision Changes; Glasses / Contacts Ear/Nose/Mouth/Throat Complaints and Symptoms: Negative for: Chronic sinus problems or rhinitis Cardiovascular Complaints and Symptoms: Negative for: Chest pain Endocrine Complaints and Symptoms: Negative for: Heat/cold intolerance Genitourinary Complaints and Symptoms: Negative for: Frequent urination Integumentary (Skin) Complaints and Symptoms: Positive for: Wounds - left leg Musculoskeletal Complaints and Symptoms: Negative for:  Muscle Pain; Muscle Weakness Medical History: Past Medical History Notes: bursitis R shoulder Neurologic Complaints and Symptoms: Negative for: Numbness/parasthesias Psychiatric Complaints and Symptoms: Negative for: Claustrophobia Hematologic/Lymphatic Respiratory Medical History: Past Medical History Notes: chronic bronchitis Gastrointestinal Medical History: Positive for: Colitis Andrea Daugherty, Andrea Daugherty ([id number]) [id number] Page 6 of 6 Immunological Oncologic Immunizations Pneumococcal Vaccine: Received Pneumococcal Vaccination: No Implantable Devices None Family and Social History Cancer: Yes - Mother; Diabetes: No; Heart Disease: Yes - Father; Hypertension: Yes - Father; Kidney Disease: No; Lung Disease: Yes - Father; Seizures: No; Stroke: Yes - Siblings; Thyroid Problems: No; Tuberculosis: No; Current every day smoker - half pack day; Marital  Status - Married; Alcohol Use: Never; Drug Use: No History; Caffeine Use: Daily; Financial Concerns: No; Food, Clothing or Shelter Needs: No; Support System Lacking: No; Transportation Concerns: No Psychologist, prison and probation services) Signed: 11/14/2022 10:07:31 AM By: Duanne Guess MD FACS Signed: 11/14/2022 4:20:37 PM By: Redmond Pulling RN, BSN Entered By: Redmond Pulling on 11/14/2022 08:29:05 -------------------------------------------------------------------------------- SuperBill Details Patient Name: Date of Service: Andrea Daugherty, Andrea Daugherty 11/14/2022 Medical Record Number: [id number] Patient Account Number: [id number] Date of Birth/Sex: Treating RN: [birth year] (52 y.o. F) Primary Care Provider: Melanee Spry NT Other Clinician: Referring Provider: Treating Provider/Extender: Jacqulyn Ducking, BELMO NT Weeks in Treatment: 0 Diagnosis Coding ICD-10 Codes Code Description R21 Rash and other nonspecific skin eruption J42 Unspecified chronic bronchitis Facility Procedures : CPT4 Code: 65784696 Description: 99213 - WOUND CARE VISIT-LEV 3 EST PT Modifier: Quantity: 1 Physician Procedures : CPT4 Code Description Modifier [id number] - WC PHYS LEVEL 4 - NEW PT ICD-10 Diagnosis Description R21 Rash and other nonspecific skin eruption J42 Unspecified chronic bronchitis Quantity: 1 Electronic Signature(s) Signed: 11/14/2022 10:07:31 AM By: Duanne Guess MD FACS Signed: 11/14/2022 4:20:37 PM By: Redmond Pulling RN, BSN Previous Signature: 11/14/2022 9:13:40 AM Version By: Duanne Guess MD FACS Entered By: Redmond Pulling on 11/14/2022 09:14:08

## 2022-11-14 NOTE — Progress Notes (Signed)
Andrea Daugherty, Andrea Daugherty ([id number]) [id number] Page 1 of 4 Visit Report for 11/14/2022 Abuse Risk Screen Details Patient Name: Date of Service: PEBBLE, BOTKIN 11/14/2022 8:00 A M Medical Record Number: [id number] Patient Account Number: [id number] Date of Birth/Sex: Treating RN: 03-18-1971 (52 y.o. Orville Govern Primary Care Kavina Cantave: Melanee Spry NT Other Clinician: Referring Ashutosh Dieguez: Treating Jelissa Espiritu/Extender: Duanne Guess [facility], [place] NT Weeks in Treatment: 0 Abuse Risk Screen Items Answer ABUSE RISK SCREEN: Has anyone close to you tried to hurt or harm you recentlyo No Do you feel uncomfortable with anyone in your familyo No Has anyone forced you do things that you didnt want to doo No Electronic Signature(s) Signed: 11/14/2022 4:20:37 PM By: Redmond Pulling RN, BSN Entered By: Redmond Pulling on 11/14/2022 08:29:12 -------------------------------------------------------------------------------- Activities of Daily Living Details Patient Name: Date of Service: PERCY, COMP 11/14/2022 8:00 A M Medical Record Number: [id number] Patient Account Number: [id number] Date of Birth/Sex: Treating RN: Aug 18, 1970 (52 y.o. Orville Govern Primary Care Tenleigh Byer: Melanee Spry NT Other Clinician: Referring Angela Platner: Treating Christiaan Strebeck/Extender: Duanne Guess [facility], [place] NT Weeks in Treatment: 0 Activities of Daily Living Items Answer Activities of Daily Living (Please select one for each item) Drive Automobile Completely Able T Medications ake Completely Able Use T elephone Completely Able Care for Appearance Completely Able Use T oilet Completely Able Bath / Shower Completely Able Dress Self Completely Able Feed Self Completely Able Walk Completely Able Get In / Out Bed Completely Able Housework Completely Able Prepare Meals Completely Able Handle Money Completely Able Shop for Self Completely Able Electronic Signature(s) Signed:  11/14/2022 4:20:37 PM By: Redmond Pulling RN, BSN Entered By: Redmond Pulling on 11/14/2022 08:29:36 -------------------------------------------------------------------------------- Education Screening Details Patient Name: Date of Service: Kerry Hough RO N C. 11/14/2022 8:00 A M Medical Record Number: [id number] Patient Account Number: [id number] Date of Birth/Sex: Treating RN: [birth year] (52 y.o. Orville Govern Primary Care Cyanne Delmar: Melanee Spry NT Other Clinician: Referring Brison Fiumara: Treating Milton Sagona/Extender: Duanne Guess [facility], [place] NT Weeks in TreatmentSHARMAN, GARROTT ([id number]) [id number] Page 2 of 4 Primary Learner Assessed: Patient Learning Preferences/Education Level/Primary Language Learning Preference: Explanation, Demonstration, Printed Material Highest Education Level: High School Preferred Language: Economist Language Barrier: No Translator Needed: No Memory Deficit: No Emotional Barrier: No Cultural/Religious Beliefs Affecting Medical Care: No Physical Barrier Impaired Vision: No Impaired Hearing: No Decreased Hand dexterity: No Knowledge/Comprehension Knowledge Level: High Comprehension Level: High Ability to understand written instructions: High Ability to understand verbal instructions: High Motivation Anxiety Level: Calm Cooperation: Cooperative Education Importance: Acknowledges Need Interest in Health Problems: Asks Questions Perception: Coherent Willingness to Engage in Self-Management High Activities: Readiness to Engage in Self-Management High Activities: Electronic Signature(s) Signed: 11/14/2022 4:20:37 PM By: Redmond Pulling RN, BSN Entered By: Redmond Pulling on 11/14/2022 08:30:07 -------------------------------------------------------------------------------- Fall Risk Assessment Details Patient Name: Date of Service: Kerry Hough RO N C. 11/14/2022 8:00 A M Medical Record Number:  [id number] Patient Account Number: [id number] Date of Birth/Sex: Treating RN: 04/15/1971 (52 y.o. Orville Govern Primary Care Kaliyan Osbourn: Melanee Spry NT Other Clinician: Referring Aviyah Swetz: Treating Selyna Klahn/Extender: Duanne Guess [facility] NT Weeks in Treatment: 0 Fall Risk Assessment Items Have you had 2 or more falls in the last 12 monthso 0 No Have you had any fall that resulted in injury in the last 12 monthso 0 No FALLS RISK SCREEN History of falling - immediate or within 3 months 0 No Secondary diagnosis (Do you have 2 or more medical  diagnoseso) 0 No Ambulatory aid None/bed rest/wheelchair/nurse 0 No Crutches/cane/walker 0 No Furniture 0 No Intravenous therapy Access/Saline/Heparin Lock 0 No Gait/Transferring Normal/ bed rest/ wheelchair 0 No Weak (short steps with or without shuffle, stooped but able to lift head while walking, may seek 0 No support from furniture) Impaired (short steps with shuffle, may have difficulty arising from chair, head down, impaired 0 No balance) Mental Status Oriented to own ability 0 No Overestimates or forgets limitations 0 No Risk Level: Low Risk Score: 0 Malveaux, Warrene C ([id number]) [phone] [id number] Page 3 of 4 Electronic Signature(s) -------------------------------------------------------------------------------- Foot Assessment Details Patient Name: Date of Service: AFSANA, LIERA 11/14/2022 8:00 A M Medical Record Number: [id number] Patient Account Number: [id number] Date of Birth/Sex: Treating RN: 06/22/1971 (52 y.o. Orville Govern Primary Care Mariely Mahr: Melanee Spry NT Other Clinician: Referring Ryot Burrous: Treating Anusha Claus/Extender: Duanne Guess [facility], [place] NT Weeks in Treatment: 0 Foot Assessment Items Site Locations + = Sensation present, - = Sensation absent, C = Callus, U = Ulcer R = Redness, W = Warmth, M = Maceration, PU = Pre-ulcerative lesion F = Fissure, S = Swelling, D =  Dryness Assessment Right: Left: Other Deformity: No No Prior Foot Ulcer: No No Prior Amputation: No No Charcot Joint: No No Ambulatory Status: Gait: Electronic Signature(s) Signed: 11/14/2022 4:20:37 PM By: Redmond Pulling RN, BSN Entered By: Redmond Pulling on 11/14/2022 08:32:14 -------------------------------------------------------------------------------- Nutrition Risk Screening Details Patient Name: Date of Service: Kerry Hough RO N C. 11/14/2022 8:00 A M Medical Record Number: [id number] Patient Account Number: [id number] Date of Birth/Sex: Treating RN: September 02, 1970 (52 y.o. Orville Govern Primary Care Zein Helbing: Melanee Spry NT Other Clinician: Referring Jaide Hillenburg: Treating Letisia Schwalb/Extender: Duanne Guess [facility], [place] NT Weeks in Treatment: 0 Height (in): 61 Weight (lbs): 133 Body Mass Index (BMI): 25.1 Tenorio, Norberta C ([id number]) [phone] [id number] Page 4 of 4 Nutrition Risk Screening Items Score Screening NUTRITION RISK SCREEN: I have an illness or condition that made me change the kind and/or amount of food I eat 0 No I eat fewer than two meals per day 0 No I eat few fruits and vegetables, or milk products 0 No I have three or more drinks of beer, liquor or wine almost every day 0 No I have tooth or mouth problems that make it hard for me to eat 0 No I don't always have enough money to buy the food I need 0 No I eat alone most of the time 0 No I take three or more different prescribed or over-the-counter drugs a day 0 No Without wanting to, I have lost or gained 10 pounds in the last six months 0 No I am not always physically able to shop, cook and/or feed myself 0 No Nutrition Protocols Good Risk Protocol Moderate Risk Protocol High Risk Proctocol Risk Level: Good Risk Score: 0 Electronic Signature(s) Signed: 11/14/2022 4:20:37 PM By: Redmond Pulling RN, BSN Entered By: Redmond Pulling on 11/14/2022 08:30:36

## 2022-11-14 NOTE — Progress Notes (Signed)
KIMARIA, STRUTHERS ([id number]) [id number] Page 1 of 6 Visit Report for 11/14/2022 Allergy List Details Patient Name: Date of Service: Andrea Daugherty, Andrea Daugherty 11/14/2022 8:00 A M Medical Record Number: [id number] Patient Account Number: [id number] Date of Birth/Sex: Treating RN: 02-02-1971 (52 y.o. Orville Govern Primary Care Boniface Goffe: Melanee Spry NT Other Clinician: Referring Ambri Miltner: Treating Colbe Viviano/Extender: Duanne Guess [facility], [place] Weeks in Treatment: 0 Allergies Active Allergies hydrocodone Reaction: rash Severity: Mild Allergy Notes Electronic Signature(s) Signed: 11/14/2022 4:20:37 PM By: Redmond Pulling RN, BSN Entered By: Redmond Pulling on 11/14/2022 08:22:04 -------------------------------------------------------------------------------- Arrival Information Details Patient Name: Date of Service: Andrea Hough RO N Daugherty. 11/14/2022 8:00 A M Medical Record Number: [id number] Patient Account Number: [id number] Date of Birth/Sex: Treating RN: 12-13-1970 (52 y.o. F) Primary Care Mayce Noyes: Melanee Spry NT Other Clinician: Referring Stan Cantave: Treating Geneve Kimpel/Extender: Jacqulyn Ducking, [place] Weeks in Treatment: 0 Visit Information Patient Arrived: Ambulatory Arrival Time: 08:12 Accompanied By: self Transfer Assistance: None Patient Identification Verified: Yes Secondary Verification Process Completed: Yes Electronic Signature(s) Signed: 11/14/2022 4:20:37 PM By: Redmond Pulling RN, BSN Entered By: Redmond Pulling on 11/14/2022 08:21:15 -------------------------------------------------------------------------------- Clinic Level of Care Assessment Details Patient Name: Date of Service: Andrea Daugherty, Andrea RO N Daugherty. 11/14/2022 8:00 A M Medical Record Number: [id number] Patient Account Number: [id number] Date of Birth/Sex: Treating RN: 1971/06/28 (52 y.o. Orville Govern Primary Care Joyelle Siedlecki: Melanee Spry NT Other Clinician: Referring Rasheed Welty: Treating  Reginald Mangels/Extender: Duanne Guess [facility] [place], [place] Weeks in Treatment: 0 Clinic Level of Care Assessment Items TOOL 2 Quantity Score X- 1 0 Use when only an EandM is performed on the INITIAL visit ASSESSMENTS - Nursing Assessment / Reassessment X- 1 20 General Physical Exam (combine w/ comprehensive assessment (listed just below) when performed on new pt. evals) X- 1 25 Comprehensive Assessment (HX, ROS, Risk Assessments, Wounds Hx, etc.) Andrea Daugherty, Andrea Daugherty ([id number]) [phone].pdf Page 2 of 6 ASSESSMENTS - Wound and Skin A ssessment / Reassessment X - Simple Wound Assessment / Reassessment - one wound 1 5  - 0 Complex Wound Assessment / Reassessment - multiple wounds  - 0 Dermatologic / Skin Assessment (not related to wound area) ASSESSMENTS - Ostomy and/or Continence Assessment and Care  - 0 Incontinence Assessment and Management  - 0 Ostomy Care Assessment and Management (repouching, etc.) PROCESS - Coordination of Care X - Simple Patient / Family Education for ongoing care 1 15  - 0 Complex (extensive) Patient / Family Education for ongoing care X- 1 10 Staff obtains Chiropractor, Records, T Results / Process Orders est  - 0 Staff telephones HHA, Nursing Homes / Clarify orders / etc  - 0 Routine Transfer to another Facility (non-emergent condition)  - 0 Routine Hospital Admission (non-emergent condition) X- 1 15 New Admissions / Manufacturing engineer / Ordering NPWT Apligraf, etc. ,  - 0 Emergency Hospital Admission (emergent condition)  - 0 Simple Discharge Coordination  - 0 Complex (extensive) Discharge Coordination PROCESS - Special Needs  - 0 Pediatric / Minor Patient Management  - 0 Isolation Patient Management  - 0 Hearing / Language / Visual special needs  - 0 Assessment of Community assistance (transportation, D/Daugherty planning, etc.)  - 0 Additional assistance / Altered mentation  - 0 Support  Surface(s) Assessment (bed, cushion, seat, etc.) INTERVENTIONS - Wound Cleansing / Measurement X- 1 5 Wound Imaging (photographs - any number of wounds)  - 0 Wound Tracing (instead of photographs)  - 0 Simple Wound Measurement - one wound  - 0 Complex  Wound Measurement - multiple wounds X- 1 5 Simple Wound Cleansing - one wound []  - 0 Complex Wound Cleansing - multiple wounds INTERVENTIONS - Wound Dressings []  - 0 Small Wound Dressing one or multiple wounds []  - 0 Medium Wound Dressing one or multiple wounds []  - 0 Large Wound Dressing one or multiple wounds []  - 0 Application of Medications - injection INTERVENTIONS - Miscellaneous []  - 0 External ear exam []  - 0 Specimen Collection (cultures, biopsies, blood, body fluids, etc.) []  - 0 Specimen(s) / Culture(s) sent or taken to Lab for analysis []  - 0 Patient Transfer (multiple staff / Nurse, adult / Similar devices) []  - 0 Simple Staple / Suture removal (25 or less) []  - 0 Complex Staple / Suture removal (26 or more) []  - 0 Hypo / Hyperglycemic Management (close monitor of Blood Glucose) Andrea Daugherty, Andrea Daugherty ([id number]) [phone].pdf Page 3 of 6 X- 1 15 Ankle / Brachial Index (ABI) - do not check if billed separately Has the patient been seen at the hospital within the last three years: Yes Total Score: 115 Level Of Care: New/Established - Level 3 Electronic Signature(s) Signed: 11/14/2022 4:20:37 PM By: Redmond Pulling RN, BSN Entered By: Redmond Pulling on 11/14/2022 09:13:57 -------------------------------------------------------------------------------- Encounter Discharge Information Details Patient Name: Date of Service: Andrea Hough RO N Daugherty. 11/14/2022 8:00 A M Medical Record Number: [id number] Patient Account Number: [id number] Date of Birth/Sex: Treating RN: [birth year] (52 y.o. Orville Govern Primary Care Cassian Torelli: Melanee Spry NT Other Clinician: Referring Juvia Aerts: Treating  Shianna Bally/Extender: Duanne Guess [facility], [place] Weeks in Treatment: 0 Encounter Discharge Information Items Discharge Condition: Stable Ambulatory Status: Ambulatory Discharge Destination: Home Transportation: Private Auto Accompanied By: self Schedule Follow-up Appointment: Yes Clinical Summary of Care: Patient Declined Electronic Signature(s) Signed: 11/14/2022 4:20:37 PM By: Redmond Pulling RN, BSN Entered By: Redmond Pulling on 11/14/2022 09:14:47 -------------------------------------------------------------------------------- Lower Extremity Assessment Details Patient Name: Date of Service: Andrea Hough RO N Daugherty. 11/14/2022 8:00 A M Medical Record Number: [id number] Patient Account Number: [id number] Date of Birth/Sex: Treating RN: 1970/09/21 (52 y.o. Orville Govern Primary Care Varnika Butz: Melanee Spry NT Other Clinician: Referring Harles Evetts: Treating Mercades Bajaj/Extender: Duanne Guess [facility], [place] Weeks in Treatment: 0 Edema Assessment Assessed: [Left: No] [Right: No] [Left: Edema] [Right: :] Calf Left: Right: Point of Measurement: 30 cm From Medial Instep 38.8 cm Ankle Left: Right: Point of Measurement: 10 cm From Medial Instep 22 cm Knee To Floor Left: Right: From Medial Instep 40 cm Vascular Assessment Pulses: Dorsalis Pedis Palpable: [Left:Yes] Blood Pressure: Brachial: [Left:138] Ankle: [Left:Dorsalis Pedis: 120] Benevides, Andrea Daugherty ([id number]) [Left:0.87] [Right:[id number] Page 4 of 6] Electronic Signature(s) Signed: 11/14/2022 4:20:37 PM By: Redmond Pulling RN, BSN Entered By: Redmond Pulling on 11/14/2022 08:38:32 -------------------------------------------------------------------------------- Multi Wound Chart Details Patient Name: Date of Service: Andrea Hough RO N Daugherty. 11/14/2022 8:00 A M Medical Record Number: [id number] Patient Account Number: [id number] Date of Birth/Sex: Treating RN: 08-03-1970 (52 y.o. F) Primary Care Rekia Kujala:  Melanee Spry NT Other Clinician: Referring Leilanee Righetti: Treating Tyrena Gohr/Extender: Jacqulyn Ducking, [place] Weeks in Treatment: 0 Vital Signs Height(in): 61 Pulse(bpm): 72 Weight(lbs): 133 Blood Pressure(mmHg): 138/89 Body Mass Index(BMI): 25.1 Temperature(F): 98.0 Respiratory Rate(breaths/min): 20 [Treatment Notes:Wound Assessments Treatment Notes] Electronic Signature(s) Signed: 11/14/2022 9:08:03 AM By: Duanne Guess MD FACS Entered By: Duanne Guess on 11/14/2022 09:08:02 -------------------------------------------------------------------------------- Multi-Disciplinary Care Plan Details Patient Name: Date of Service: Andrea Hough RO N Daugherty. 11/14/2022 8:00 A M Medical Record Number: [id number] Patient Account Number: [id number] Date of Birth/Sex: Treating RN:  03-Apr-1971 (52 y.o. Orville Govern Primary Care Ankur Snowdon: Melanee Spry NT Other Clinician: Referring Muneer Leider: Treating Laniqua Torrens/Extender: Duanne Guess [facility], [place] NT Weeks in Treatment: 0 Active Inactive Electronic Signature(s) Signed: 11/14/2022 4:20:37 PM By: Redmond Pulling RN, BSN Entered By: Redmond Pulling on 11/14/2022 09:07:33 -------------------------------------------------------------------------------- Non-Wound Condition Assessment Details Patient Name: Date of Service: Andrea Hough RO N Daugherty. 11/14/2022 8:00 A M Medical Record Number: [id number] Patient Account Number: [id number] Date of Birth/Sex: Treating RN: 28-Feb-1971 (52 y.o. F) Primary Care Nakeshia Waldeck: Melanee Spry NT Other Clinician: Referring Ana Liaw: Treating Justis Dupas/Extender: Duanne Guess [facility], [place] Weeks in Treatment: 0 Non-Wound Condition: Condition: Rash / Dermatitis Location: Leg Side: Left Photos Andrea Daugherty, Andrea Daugherty ([id number]) [phone].pdf Page 5 of 6 Periwound Skin Texture Texture Color No Abnormalities Noted: No No Abnormalities Noted: No Moisture No Abnormalities Noted: No Electronic  Signature(s) Signed: 11/14/2022 11:35:14 AM By: Dayton Scrape Entered By: Dayton Scrape on 11/14/2022 08:41:02 -------------------------------------------------------------------------------- Pain Assessment Details Patient Name: Date of Service: Andrea Daugherty, BAHNER. 11/14/2022 8:00 A M Medical Record Number: [id number] Patient Account Number: [id number] Date of Birth/Sex: Treating RN: August 01, 1970 (52 y.o. F) Primary Care Allex Lapoint: Melanee Spry NT Other Clinician: Referring Michial Disney: Treating Nicklous Aburto/Extender: Jacqulyn Ducking, [place] Weeks in Treatment: 0 Active Problems Location of Pain Severity and Description of Pain Patient Has Paino Yes Site Locations Rate the pain. Current Pain Level: 2 Worst Pain Level: 10 Least Pain Level: 0 Tolerable Pain Level: 1 Character of Pain Describe the Pain: Tender, Throbbing Pain Management and Medication Current Pain Management: Electronic Signature(s) Signed: 11/14/2022 11:35:14 AM By: Dayton Scrape Entered By: Dayton Scrape on 11/14/2022 08:14:25 Andrea Daugherty, Andrea Daugherty ([id number]) [phone].pdf Page 6 of 6 -------------------------------------------------------------------------------- Patient/Caregiver Education Details Patient Name: Date of Service: Andrea Daugherty, Andrea RO N Daugherty. 4/22/2024andnbsp8:00 A M Medical Record Number: [id number] Patient Account Number: [id number] Date of Birth/Gender: Treating RN: 06-Aug-1970 (52 y.o. Orville Govern Primary Care Physician: Melanee Spry NT Other Clinician: Referring Physician: Treating Physician/Extender: Duanne Guess [facility], [facility] NT Weeks in Treatment: 0 Education Assessment Education Provided To: Patient Education Topics Provided Wound/Skin Impairment: Methods: Explain/Verbal Responses: State content correctly Electronic Signature(s) Signed: 11/14/2022 4:20:37 PM By: Redmond Pulling RN, BSN Entered By: Redmond Pulling on 11/14/2022  09:06:04 -------------------------------------------------------------------------------- Vitals Details Patient Name: Date of Service: Andrea Hough RO N Daugherty. 11/14/2022 8:00 A M Medical Record Number: [id number] Patient Account Number: [id number] Date of Birth/Sex: Treating RN: [birth year] (52 y.o. F) Primary Care Deloma Spindle: Melanee Spry NT Other Clinician: Referring Kaylana Fenstermacher: Treating Lashonne Shull/Extender: Jacqulyn Ducking, [place] Weeks in Treatment: 0 Vital Signs Time Taken: 08:13 Temperature (F): 98.0 Height (in): 61 Pulse (bpm): 72 Weight (lbs): 133 Respiratory Rate (breaths/min): 20 Body Mass Index (BMI): 25.1 Blood Pressure (mmHg): 138/89 Reference Range: 80 - 120 mg / dl Electronic Signature(s) Signed: 11/14/2022 4:20:37 PM By: Redmond Pulling RN, BSN Entered By: Redmond Pulling on 11/14/2022 51:76:16

## 2022-11-17 ENCOUNTER — Encounter (HOSPITAL_COMMUNITY): Payer: Self-pay

## 2022-11-24 ENCOUNTER — Ambulatory Visit (HOSPITAL_BASED_OUTPATIENT_CLINIC_OR_DEPARTMENT_OTHER): Payer: BC Managed Care – PPO | Admitting: Internal Medicine

## 2022-11-29 ENCOUNTER — Encounter (HOSPITAL_BASED_OUTPATIENT_CLINIC_OR_DEPARTMENT_OTHER): Payer: BC Managed Care – PPO | Admitting: General Surgery

## 2022-12-12 DIAGNOSIS — L02426 Furuncle of left lower limb: Secondary | ICD-10-CM | POA: Diagnosis not present

## 2022-12-12 DIAGNOSIS — B9689 Other specified bacterial agents as the cause of diseases classified elsewhere: Secondary | ICD-10-CM | POA: Diagnosis not present

## 2022-12-22 ENCOUNTER — Ambulatory Visit
Admission: EM | Admit: 2022-12-22 | Discharge: 2022-12-22 | Disposition: A | Discharge status: Home / Self Care | Payer: BC Managed Care – PPO

## 2022-12-22 DIAGNOSIS — J441 Chronic obstructive pulmonary disease with (acute) exacerbation: Secondary | ICD-10-CM | POA: Diagnosis not present

## 2022-12-22 DIAGNOSIS — Z8709 Personal history of other diseases of the respiratory system: Secondary | ICD-10-CM | POA: Diagnosis not present

## 2022-12-22 MED ORDER — METHYLPREDNISOLONE SODIUM SUCC 125 MG IJ SOLR
60.0000 mg | Freq: Once | INTRAMUSCULAR | Status: AC
  Administered 2022-12-22: 60 mg via INTRAMUSCULAR

## 2022-12-22 MED ORDER — IPRATROPIUM-ALBUTEROL 0.5-2.5 (3) MG/3ML IN SOLN
3.0000 mL | Freq: Once | RESPIRATORY_TRACT | Status: AC
  Administered 2022-12-22: 3 mL via RESPIRATORY_TRACT

## 2022-12-22 MED ORDER — PREDNISONE 20 MG PO TABS: 40.0000 mg | ORAL_TABLET | Freq: Every day | ORAL | 0 refills | Status: AC

## 2022-12-22 NOTE — Discharge Instructions (Addendum)
Take medication as prescribed.  Continue your current medications to include Symbicort and cetirizine.  I would like for you to follow-up with the wound care doctor regarding the doxycycline you are currently taking. Increase fluids and allow for plenty of rest. May take over-the-counter Tylenol or ibuprofen as needed for pain, fever, general discomfort. Consider smoking cessation as this may help improve your symptoms, and decrease reoccurrence of breathing problems. Recommend using a humidifier in your bedroom at nighttime during sleep and sleeping elevated on pillows while cough symptoms persist. Go to the emergency department immediately if you experience shortness of breath, difficulty breathing, become unable to speak in a complete sentence, or other concerns. As discussed, there is information in your chart to help you find a primary care physician.  You can access this information using your MyChart account.  The information was sent on 11/17/2022.  We are also giving you information for primary care physician in this area who is excepting new patients. Follow-up as needed.

## 2022-12-22 NOTE — ED Provider Notes (Signed)
[facility]    CSN: [id number] Arrival date & time: 12/22/22  0831      History   Chief Complaint No chief complaint on file.   HPI Andrea Daugherty is a 52 y.o. female.   The history is provided by the patient.   Patient presents for complaints of cough and wheezing.  Patient states symptoms have been present for the past several days.  Patient reports history of COPD to include chronic bronchitis.  She reports that she is still currently smoking, "but is smoking a lot less".    Patient was referred at her last urgent care visit to wound care for a chronic wound on her left lower leg..  She states that she is currently taking doxycycline for her left lower leg. She states that she was told that the antibiotic should also help with her bronchitis.  Patient reports that she has been using her albuterol inhaler over the past several days since her symptoms started.  She denies fever, chills, headache, sore throat, difficulty breathing, chest pain, abdominal pain, nausea, vomiting, or diarrhea.  Patient reports that she does not have a primary care physician, that she is currently looking for one as she was going to [place], but has not been there in several years.  History reviewed. No pertinent past medical history.  Patient Active Problem List   Diagnosis Date Noted   Tennis elbow syndrome 07/11/2013   Shoulder pain 08/17/2011   Rotator cuff syndrome of right shoulder 08/17/2011    Past Surgical History:  Procedure Laterality Date   CESAREAN SECTION     DILATION AND CURETTAGE OF UTERUS     NOVASURE ABLATION     TUBAL LIGATION     WISDOM TOOTH EXTRACTION      OB History     Gravida  4   Para  3   Term  3   Preterm      AB  1   Living  3      SAB  1   IAB      Ectopic      Multiple      Live Births               Home Medications    Prior to Admission medications   Medication Sig Start Date End Date Taking? Authorizing Provider   clindamycin (CLEOCIN T) 1 % lotion Apply topically 2 (two) times daily as needed. 12/12/22  Yes [provider]  doxycycline (VIBRA-TABS) 100 MG tablet Take 100 mg by mouth 2 (two) times daily. 12/12/22  Yes [provider]  albuterol (VENTOLIN HFA) 108 (90 Base) MCG/ACT inhaler Inhale 2 puffs into the lungs every 4 (four) hours as needed for wheezing or shortness of breath. 10/28/22   Raspet, Noberto Retort, PA-C  budesonide-formoterol (SYMBICORT) 80-4.5 MCG/ACT inhaler Inhale 2 puffs into the lungs in the morning and at bedtime. Rinse mouth out after each use 10/28/22   Raspet, Erin K, PA-C  Ibuprofen 200 MG CAPS Take by mouth.    [provider]  levocetirizine (XYZAL ALLERGY 24HR) 5 MG tablet Take 1 tablet (5 mg total) by mouth every evening. 10/28/22   Raspet, Noberto Retort, PA-C    Family History Family History  Problem Relation Age of Onset   Cancer Mother    Heart failure Father     Social History Social History   Tobacco Use   Smoking status: Every Day    Packs/day: 0.50  Years: 28.00    Additional pack years: 0.00    Total pack years: 14.00    Types: Cigarettes   Smokeless tobacco: Never  Substance Use Topics   Alcohol use: No   Drug use: No     Allergies   Hydrocodone   Review of Systems Review of Systems Per HPI  Physical Exam Triage Vital Signs ED Triage Vitals  Enc Vitals Group     BP 12/22/22 0843 (!) 139/93     Pulse Rate 12/22/22 0843 81     Resp 12/22/22 0843 18     Temp 12/22/22 0843 98.2 F (36.8 C)     Temp Source 12/22/22 0843 Oral     SpO2 12/22/22 0843 95 %     Weight --      Height --      Head Circumference --      Peak Flow --      Pain Score 12/22/22 0847 0     Pain Loc --      Pain Edu? --      Excl. in GC? --    No data found.  Updated Vital Signs BP (!) 139/93 (BP Location: Right Arm)   Pulse 81   Temp 98.2 F (36.8 C) (Oral)   Resp 18   LMP  (LMP Unknown)   SpO2 95%   Visual Acuity Right Eye Distance:    Left Eye Distance:   Bilateral Distance:    Right Eye Near:   Left Eye Near:    Bilateral Near:     Physical Exam Vitals and nursing note reviewed.  Constitutional:      General: She is not in acute distress.    Appearance: Normal appearance.  HENT:     Head: Normocephalic.     Right Ear: Tympanic membrane, ear canal and external ear normal.     Left Ear: Tympanic membrane, ear canal and external ear normal.     Nose: Nose normal.     Mouth/Throat:     Mouth: Mucous membranes are moist.     Pharynx: Posterior oropharyngeal erythema present.     Comments: Cobblestoning present on posterior oropharynx Eyes:     Extraocular Movements: Extraocular movements intact.     Conjunctiva/sclera: Conjunctivae normal.     Pupils: Pupils are equal, round, and reactive to light.  Cardiovascular:     Rate and Rhythm: Normal rate and regular rhythm.     Pulses: Normal pulses.     Heart sounds: Normal heart sounds.  Pulmonary:     Effort: Pulmonary effort is normal.     Breath sounds: Examination of the right-upper field reveals wheezing. Examination of the left-upper field reveals wheezing. Examination of the right-lower field reveals wheezing. Examination of the left-lower field reveals wheezing. Wheezing present.     Comments: DuoNeb was administered.  Decreased wheezing noted throughout post nebulizer txment.  Continues to have expiratory wheezing. Abdominal:     General: Bowel sounds are normal.     Palpations: Abdomen is soft.     Tenderness: There is no abdominal tenderness.  Musculoskeletal:     Cervical back: Normal range of motion.  Lymphadenopathy:     Cervical: No cervical adenopathy.  Skin:    General: Skin is warm and dry.  Neurological:     General: No focal deficit present.     Mental Status: She is alert and oriented to person, place, and time.  Psychiatric:        Mood and  Affect: Mood normal.        Behavior: Behavior normal.      UC Treatments / Results   Labs (all labs ordered are listed, but only abnormal results are displayed) Labs Reviewed - No data to display  EKG   Radiology No results found.  Procedures Procedures (including critical care time)  Medications Ordered in UC Medications  ipratropium-albuterol (DUONEB) 0.5-2.5 (3) MG/3ML nebulizer solution 3 mL (3 mLs Nebulization Given 12/22/22 0901)  methylPREDNISolone sodium succinate (SOLU-MEDROL) 125 mg/2 mL injection 60 mg (60 mg Intramuscular Given 12/22/22 0900)    Initial Impression / Assessment and Plan / UC Course  I have reviewed the triage vital signs and the nursing notes.  Pertinent labs & imaging results that were available during my care of the patient were reviewed by me and considered in my medical decision making (see chart for details).  The patient is well-appearing, she is in no acute distress, vital signs are stable.  Room air sat at 100%.  DuoNeb and Solu-Medrol 60 mg IM was administered.  Symptoms appear to be consistent with an acute COPD exacerbation.  Patient also has an underlying history of chronic bronchitis.  Prednisone 40 mg was prescribed for the next 5 days.  Continue Symbicort and levocetirizine previously prescribed.  Patient was advised to consider smoking cessation as this will continue to exacerbate her symptoms.  Patient was also advised to follow-up with wound care to determine if there is a another antibiotic option for her if symptoms are not improving.  Patient also was advised that there is information that was sent to her through her MyChart account to help her find a primary care physician.  Patient was also given information for primary care office in the area who is excepting new patients.  Patient was advised to follow-up with a PCP as soon as possible, also consider establishing care with pulmonology.  Patient was given strict ER follow-up precautions.  Patient is in agreement with this plan of care and verbalizes understanding.  All  questions were answered.  Patient stable for discharge.   Final Clinical Impressions(s) / UC Diagnoses   Final diagnoses:  None   Discharge Instructions   None    ED Prescriptions   None    PDMP not reviewed this encounter.   Abran Cantor, NP 12/22/22 [phone]

## 2022-12-22 NOTE — ED Triage Notes (Signed)
Pt reports she has some wheezing, cough, mucus x 4 days.  Pt was put on doxycyline  for a wound on her left leg but sxs seems to be getting worse x 1.5 weeks.

## 2022-12-30 IMAGING — DX DG CHEST 2V
1 series · 1 of 1 positions shown · non-contrast
Comparison: July 06, 2020

CLINICAL DATA: Cough and wheezing.

EXAM:
CHEST - 2 VIEW

[chest pa]
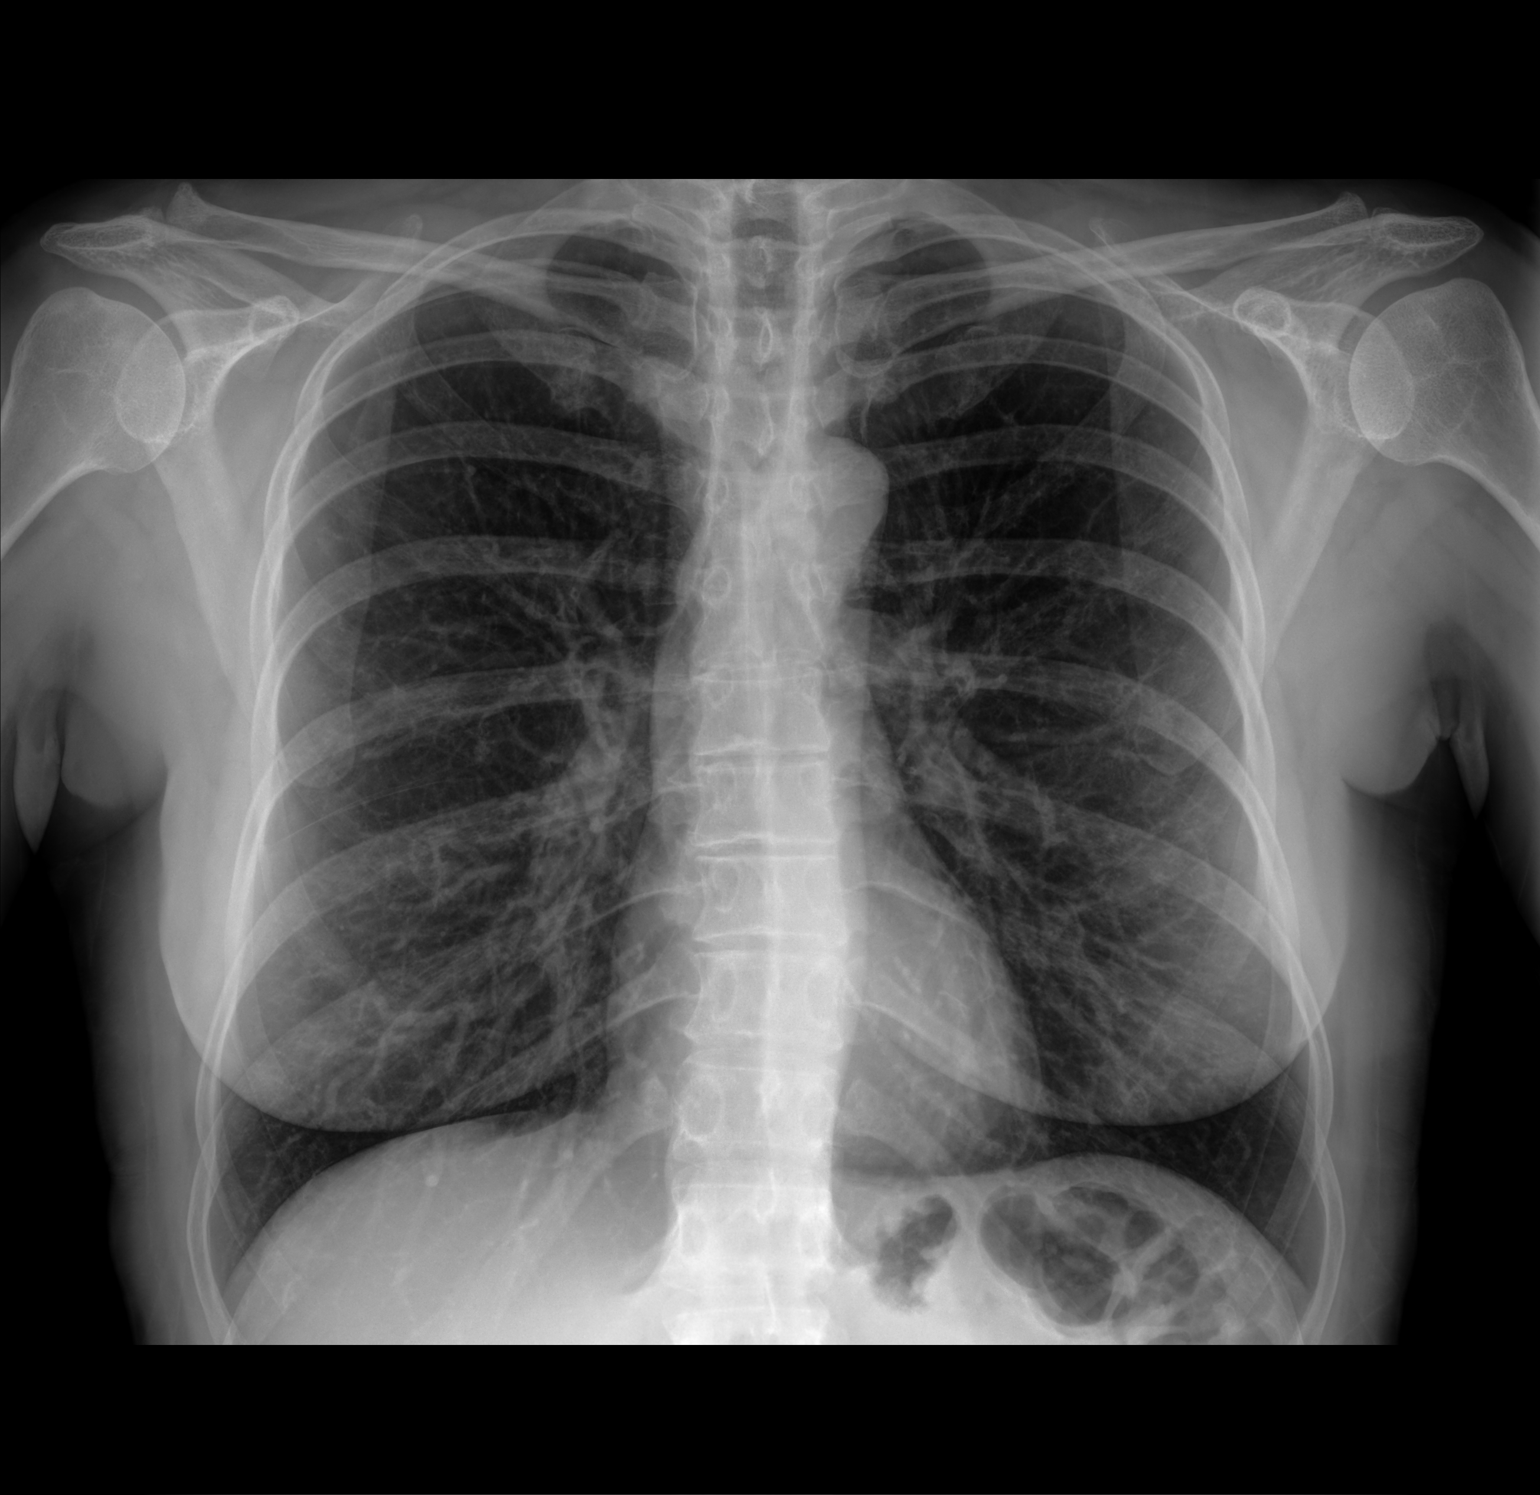

[1 of 1 positions shown; findings below may reference images not displayed]

FINDINGS: The heart size and mediastinal contours are within normal limits.
Both lungs are clear. The visualized skeletal structures are
unremarkable.
IMPRESSION: No active cardiopulmonary disease.

## 2023-02-18 ENCOUNTER — Ambulatory Visit
Admission: EM | Admit: 2023-02-18 | Discharge: 2023-02-18 | Disposition: A | Discharge status: Home / Self Care | Payer: BC Managed Care – PPO

## 2023-02-18 DIAGNOSIS — H5789 Other specified disorders of eye and adnexa: Secondary | ICD-10-CM

## 2023-02-18 DIAGNOSIS — T63441A Toxic effect of venom of bees, accidental (unintentional), initial encounter: Secondary | ICD-10-CM | POA: Diagnosis not present

## 2023-02-18 MED ORDER — ERYTHROMYCIN 5 MG/GM OP OINT: TOPICAL_OINTMENT | OPHTHALMIC | 0 refills | Status: DC

## 2023-02-18 MED ORDER — METHYLPREDNISOLONE SODIUM SUCC 125 MG IJ SOLR: 80.0000 mg | Freq: Once | INTRAMUSCULAR | Status: DC

## 2023-02-18 NOTE — ED Provider Notes (Signed)
[facility]    CSN: [id number] Arrival date & time: 02/18/23  6962      History   Chief Complaint No chief complaint on file.   HPI Andrea Daugherty is a 52 y.o. female.   Presenting today with left eye pain, redness, swelling after being stung in the eye this morning about an hour prior to arrival.  She states eye is watering and having some mild blurry vision, no hives, throat itching or swelling, difficulty breathing or swallowing, chest tightness, nausea, vomiting.  So far has not tried anything over-the-counter for symptoms.    History reviewed. No pertinent past medical history.  Patient Active Problem List   Diagnosis Date Noted   Tennis elbow syndrome 07/11/2013   Shoulder pain 08/17/2011   Rotator cuff syndrome of right shoulder 08/17/2011    Past Surgical History:  Procedure Laterality Date   CESAREAN SECTION     DILATION AND CURETTAGE OF UTERUS     NOVASURE ABLATION     TUBAL LIGATION     WISDOM TOOTH EXTRACTION      OB History     Gravida  4   Para  3   Term  3   Preterm      AB  1   Living  3      SAB  1   IAB      Ectopic      Multiple      Live Births               Home Medications    Prior to Admission medications   Medication Sig Start Date End Date Taking? Authorizing Provider  erythromycin ophthalmic ointment Place a 1/2 inch ribbon of ointment into the left lower eyelid BID prn. 02/18/23  Yes Particia Nearing, PA-C  loratadine (CLARITIN) 10 MG tablet Take 10 mg by mouth daily.   Yes [provider]  albuterol (VENTOLIN HFA) 108 (90 Base) MCG/ACT inhaler Inhale 2 puffs into the lungs every 4 (four) hours as needed for wheezing or shortness of breath. 10/28/22   Raspet, Noberto Retort, PA-C  budesonide-formoterol (SYMBICORT) 80-4.5 MCG/ACT inhaler Inhale 2 puffs into the lungs in the morning and at bedtime. Rinse mouth out after each use 10/28/22   Raspet, Erin K, PA-C  clindamycin (CLEOCIN T) 1 % lotion  Apply topically 2 (two) times daily as needed. 12/12/22   [provider]  doxycycline (VIBRA-TABS) 100 MG tablet Take 100 mg by mouth 2 (two) times daily. 12/12/22   [provider]  Ibuprofen 200 MG CAPS Take by mouth.    [provider]  levocetirizine (XYZAL ALLERGY 24HR) 5 MG tablet Take 1 tablet (5 mg total) by mouth every evening. 10/28/22   Raspet, Noberto Retort, PA-C    Family History Family History  Problem Relation Age of Onset   Cancer Mother    Heart failure Father     Social History Social History   Tobacco Use   Smoking status: Every Day    Current packs/day: 0.50    Average packs/day: 0.5 packs/day for 28.0 years (14.0 ttl pk-yrs)    Types: Cigarettes   Smokeless tobacco: Never  Substance Use Topics   Alcohol use: No   Drug use: No     Allergies   Wasp venom and Hydrocodone   Review of Systems Review of Systems Per HPI  Physical Exam Triage Vital Signs ED Triage Vitals [02/18/23 1016]  Encounter Vitals Group  BP 131/89     Systolic BP Percentile      Diastolic BP Percentile      Pulse Rate 72     Resp 18     Temp 98.5 F (36.9 C)     Temp Source Oral     SpO2 96 %     Weight      Height      Head Circumference      Peak Flow      Pain Score 6     Pain Loc      Pain Education      Exclude from Growth Chart    No data found.  Updated Vital Signs BP 131/89 (BP Location: Right Arm)   Pulse 72   Temp 98.5 F (36.9 C) (Oral)   Resp 18   LMP  (LMP Unknown)   SpO2 96%   Visual Acuity Right Eye Distance: 20/25 Left Eye Distance: 20/25 Bilateral Distance: 20/20  Right Eye Near:   Left Eye Near:    Bilateral Near:     Physical Exam Vitals and nursing note reviewed.  Constitutional:      Appearance: Normal appearance. She is not ill-appearing.  HENT:     Head: Atraumatic.  Eyes:     General:        Left eye: Discharge present.    Extraocular Movements: Extraocular movements intact.     Pupils: Pupils are  equal, round, and reactive to light.     Comments: Left conjunctival edema, injection.  No foreign body appreciable on exam.  EOMs intact bilaterally.  Left upper and lower eyelid edema  Cardiovascular:     Rate and Rhythm: Normal rate and regular rhythm.     Heart sounds: Normal heart sounds.  Pulmonary:     Effort: Pulmonary effort is normal.     Breath sounds: Normal breath sounds. No wheezing or rales.  Musculoskeletal:        General: Normal range of motion.     Cervical back: Normal range of motion and neck supple.  Skin:    General: Skin is warm and dry.  Neurological:     Mental Status: She is alert and oriented to person, place, and time.  Psychiatric:        Mood and Affect: Mood normal.        Thought Content: Thought content normal.        Judgment: Judgment normal.      UC Treatments / Results  Labs (all labs ordered are listed, but only abnormal results are displayed) Labs Reviewed - No data to display  EKG   Radiology No results found.  Procedures Procedures (including critical care time)  Medications Ordered in UC Medications  methylPREDNISolone sodium succinate (SOLU-MEDROL) 125 mg/2 mL injection 80 mg (has no administration in time range)    Initial Impression / Assessment and Plan / UC Course  I have reviewed the triage vital signs and the nursing notes.  Pertinent labs & imaging results that were available during my care of the patient were reviewed by me and considered in my medical decision making (see chart for details).     Treat with IM Solu-Medrol, antihistamines, ice off-and-on.  Erythromycin ointment for comfort and protection of infection while conjunctiva is edematous.  Vision intact per visual acuity today.  No evidence of a systemic reaction.  Return for worsening symptoms.  Final Clinical Impressions(s) / UC Diagnoses   Final diagnoses:  Eye swelling, left  Bee  sting reaction, accidental or unintentional, initial encounter      Discharge Instructions      We have given you a steroid shot to help with the allergic site reaction to the eye from the sting.  Take an antihistamine such as Claritin twice daily until resolved and use the antibiotic eye ointment twice daily until improved.  Ice the area off-and-on additionally.  Follow-up right away if symptoms are worsening     ED Prescriptions     Medication Sig Dispense Auth. Provider   erythromycin ophthalmic ointment Place a 1/2 inch ribbon of ointment into the left lower eyelid BID prn. 3.5 g Particia Nearing, PA-C      PDMP not reviewed this encounter.   Particia Nearing, [place] 02/18/23 1106

## 2023-02-18 NOTE — Discharge Instructions (Signed)
We have given you a steroid shot to help with the allergic site reaction to the eye from the sting.  Take an antihistamine such as Claritin twice daily until resolved and use the antibiotic eye ointment twice daily until improved.  Ice the area off-and-on additionally.  Follow-up right away if symptoms are worsening

## 2023-02-18 NOTE — ED Triage Notes (Signed)
Pt reports she got stuck in her left eye by a wasp this morning about an 1hr ago. States she is having blurry vision and her eye is red as well as the surrounding area of the left eye and swollen.  Has not taken anything for sxs.

## 2023-02-20 DIAGNOSIS — H10413 Chronic giant papillary conjunctivitis, bilateral: Secondary | ICD-10-CM | POA: Diagnosis not present

## 2023-03-31 ENCOUNTER — Ambulatory Visit
Admission: EM | Admit: 2023-03-31 | Discharge: 2023-03-31 | Disposition: A | Discharge status: Home / Self Care | Payer: BC Managed Care – PPO | Attending: Physician Assistant | Admitting: Physician Assistant

## 2023-03-31 DIAGNOSIS — Z1152 Encounter for screening for COVID-19: Secondary | ICD-10-CM | POA: Insufficient documentation

## 2023-03-31 DIAGNOSIS — J209 Acute bronchitis, unspecified: Secondary | ICD-10-CM | POA: Diagnosis not present

## 2023-03-31 DIAGNOSIS — J440 Chronic obstructive pulmonary disease with acute lower respiratory infection: Secondary | ICD-10-CM | POA: Diagnosis not present

## 2023-03-31 DIAGNOSIS — R051 Acute cough: Secondary | ICD-10-CM | POA: Insufficient documentation

## 2023-03-31 MED ORDER — AZITHROMYCIN 250 MG PO TABS: 250.0000 mg | ORAL_TABLET | Freq: Every day | ORAL | 0 refills | Status: DC

## 2023-03-31 MED ORDER — BUDESONIDE-FORMOTEROL FUMARATE 80-4.5 MCG/ACT IN AERO: 2.0000 | INHALATION_SPRAY | Freq: Two times a day (BID) | RESPIRATORY_TRACT | 2 refills | Status: DC

## 2023-03-31 NOTE — Discharge Instructions (Signed)
We will contact you if you are positive for COVID.  If you are positive you are candidate for Paxlovid.  If you are interested in starting this medication please let us know when we call you with your COVID results.  In the meantime, we are going to treat for bronchitis.  Take azithromycin as prescribed.  Continue your Symbicort as previously prescribed and I have sent in a refill to the pharmacy.  If your symptoms are not improving quickly or if anything worsens and you have increasing cough, shortness of breath, nausea/vomiting, weakness, high fever you need to be seen immediately.  You can use over-the-counter medications for additional symptom relief including Mucinex, Flonase, nasal saline/sinus rinses.

## 2023-03-31 NOTE — ED Provider Notes (Signed)
[facility]    CSN: [id number] Arrival date & time: 03/31/23  0825      History   Chief Complaint No chief complaint on file.   HPI Andrea Daugherty is a 52 y.o. female.   Patient presents today with a 36-hour history of worsening cough.  Reports yesterday she woke up feeling very fatigued and then today she developed congestion, drainage, cough.  She does have a history of chronic bronchitis and takes Symbicort daily.  She has not been using her albuterol inhaler as she found this worsened her symptoms and they are adequately controlled with Symbicort alone.  She is low on this medication requesting a refill.  She is a current everyday smoker.  She denies formal diagnosis of asthma.  She was last treated for chronic bronchitis Dec 22, 2022.  She denies additional antibiotics or steroids since that time.  She denies any known sick contacts but does work around many people.  She has had COVID in the past with last episode several years ago.  She is not taking any over-the-counter medication for symptom management.    History reviewed. No pertinent past medical history.  Patient Active Problem List   Diagnosis Date Noted   Tennis elbow syndrome 07/11/2013   Shoulder pain 08/17/2011   Rotator cuff syndrome of right shoulder 08/17/2011    Past Surgical History:  Procedure Laterality Date   CESAREAN SECTION     DILATION AND CURETTAGE OF UTERUS     NOVASURE ABLATION     TUBAL LIGATION     WISDOM TOOTH EXTRACTION      OB History     Gravida  4   Para  3   Term  3   Preterm      AB  1   Living  3      SAB  1   IAB      Ectopic      Multiple      Live Births               Home Medications    Prior to Admission medications   Medication Sig Start Date End Date Taking? Authorizing Provider  azithromycin (ZITHROMAX) 250 MG tablet Take 1 tablet (250 mg total) by mouth daily. Take first 2 tablets together, then 1 every day until finished. 03/31/23   Yes Alyric Parkin K, PA-C  albuterol (VENTOLIN HFA) 108 (90 Base) MCG/ACT inhaler Inhale 2 puffs into the lungs every 4 (four) hours as needed for wheezing or shortness of breath. 10/28/22   Roshawn Ayala, Noberto Retort, PA-C  budesonide-formoterol (SYMBICORT) 80-4.5 MCG/ACT inhaler Inhale 2 puffs into the lungs in the morning and at bedtime. Rinse mouth out after each use 03/31/23   Tkeya Stencil K, PA-C  Ibuprofen 200 MG CAPS Take by mouth.    [provider]  loratadine (CLARITIN) 10 MG tablet Take 10 mg by mouth daily.    [provider]    Family History Family History  Problem Relation Age of Onset   Cancer Mother    Heart failure Father     Social History Social History   Tobacco Use   Smoking status: Every Day    Current packs/day: 0.50    Average packs/day: 0.5 packs/day for 28.0 years (14.0 ttl pk-yrs)    Types: Cigarettes   Smokeless tobacco: Never  Substance Use Topics   Alcohol use: No   Drug use: No     Allergies   Wasp venom  and Hydrocodone   Review of Systems Review of Systems  Constitutional:  Positive for activity change and fatigue. Negative for appetite change and fever.  HENT:  Positive for congestion and postnasal drip. Negative for sinus pressure, sneezing and sore throat.   Respiratory:  Positive for cough. Negative for shortness of breath.   Cardiovascular:  Negative for chest pain.  Gastrointestinal:  Negative for abdominal pain, diarrhea, nausea and vomiting.     Physical Exam Triage Vital Signs ED Triage Vitals [03/31/23 0854]  Encounter Vitals Group     BP 121/84     Systolic BP Percentile      Diastolic BP Percentile      Pulse Rate 73     Resp 20     Temp 97.8 F (36.6 C)     Temp Source Oral     SpO2 96 %     Weight      Height      Head Circumference      Peak Flow      Pain Score 0     Pain Loc      Pain Education      Exclude from Growth Chart    No data found.  Updated Vital Signs BP 121/84 (BP Location: Right Arm)    Pulse 73   Temp 97.8 F (36.6 C) (Oral)   Resp 20   LMP  (LMP Unknown)   SpO2 96%   Visual Acuity Right Eye Distance:   Left Eye Distance:   Bilateral Distance:    Right Eye Near:   Left Eye Near:    Bilateral Near:     Physical Exam Vitals reviewed.  Constitutional:      General: She is awake. She is not in acute distress.    Appearance: Normal appearance. She is well-developed. She is not ill-appearing.     Comments: Very pleasant female appears stated age in no acute distress sitting comfortably in exam room  HENT:     Head: Normocephalic and atraumatic.     Right Ear: Tympanic membrane, ear canal and external ear normal. Tympanic membrane is not erythematous or bulging.     Left Ear: Tympanic membrane, ear canal and external ear normal. Tympanic membrane is not erythematous or bulging.     Nose:     Right Sinus: No maxillary sinus tenderness or frontal sinus tenderness.     Left Sinus: No maxillary sinus tenderness or frontal sinus tenderness.     Mouth/Throat:     Pharynx: Uvula midline. Postnasal drip present. No oropharyngeal exudate or posterior oropharyngeal erythema.  Cardiovascular:     Rate and Rhythm: Normal rate and regular rhythm.     Heart sounds: Normal heart sounds, S1 normal and S2 normal. No murmur heard. Pulmonary:     Effort: Pulmonary effort is normal.     Breath sounds: Normal breath sounds. No wheezing, rhonchi or rales.     Comments: Reactive cough with deep breathing Psychiatric:        Behavior: Behavior is cooperative.      UC Treatments / Results  Labs (all labs ordered are listed, but only abnormal results are displayed) Labs Reviewed  SARS CORONAVIRUS 2 (TAT 6-24 HRS)  BASIC METABOLIC PANEL    EKG   Radiology No results found.  Procedures Procedures (including critical care time)  Medications Ordered in UC Medications - No data to display  Initial Impression / Assessment and Plan / UC Course  I have reviewed the triage  vital signs and the nursing notes.  Pertinent labs & imaging results that were available during my care of the patient were reviewed by me and considered in my medical decision making (see chart for details).     Patient is well-appearing, afebrile, nontoxic, nontachycardic.  Chest x-ray was deferred as she had no significant adventitious lung sounds and oxygen saturation was 96%.  Concern for acute exacerbation of chronic bronchitis given her increasing cough with associated increased sputum production.  Will treat with azithromycin she reports this is the medication that works the best for her.  We did discuss potential utility of systemic steroids, however, she declined this as she has many side effects with this medication including delayed wound healing and she is still dealing with a chronic wound.  If she has worsening symptoms she will return and we will consider additional steroid use.  Will continue with inhaled steroids and refill of Symbicort was sent to her pharmacy.  Given her short duration of symptoms I am also concerned for COVID.  COVID testing was obtained and is pending.  Given her history she is a candidate for antiviral therapy and we do not have a recent metabolic panel on file.  This was obtained today for dosing of Paxlovid.  She does not require any medication adjustment while on Paxlovid.  She was encouraged to use over-the-counter medications including Mucinex, Flonase, Tylenol.  She is to use nasal saline and sinus rinses for additional symptom relief.  Discussed that if her symptoms are not improving quickly or if she has any worsening symptoms she needs to be seen immediately.  Strict return precautions given.  Work excuse note provided.  Final Clinical Impressions(s) / UC Diagnoses   Final diagnoses:  Bronchitis, chronic obstructive w acute bronchitis (HCC)  Acute cough     Discharge Instructions      We will contact you if you are positive for COVID.  If you are  positive you are candidate for Paxlovid.  If you are interested in starting this medication please let us know when we call you with your COVID results.  In the meantime, we are going to treat for bronchitis.  Take azithromycin as prescribed.  Continue your Symbicort as previously prescribed and I have sent in a refill to the pharmacy.  If your symptoms are not improving quickly or if anything worsens and you have increasing cough, shortness of breath, nausea/vomiting, weakness, high fever you need to be seen immediately.  You can use over-the-counter medications for additional symptom relief including Mucinex, Flonase, nasal saline/sinus rinses.     ED Prescriptions     Medication Sig Dispense Auth. Provider   budesonide-formoterol (SYMBICORT) 80-4.5 MCG/ACT inhaler Inhale 2 puffs into the lungs in the morning and at bedtime. Rinse mouth out after each use 1 each Mayola Mcbain K, PA-C   azithromycin (ZITHROMAX) 250 MG tablet Take 1 tablet (250 mg total) by mouth daily. Take first 2 tablets together, then 1 every day until finished. 6 tablet Joana Nolton, Noberto Retort, PA-C      PDMP not reviewed this encounter.   Jeani Hawking, PA-C 03/31/23 1610

## 2023-03-31 NOTE — ED Triage Notes (Signed)
Pt reports she has been  coughing since this morning.

## 2023-04-01 LAB — BASIC METABOLIC PANEL
BUN/Creatinine Ratio: 11 (ref 9–23)
BUN: 8 mg/dL (ref 6–24)
CO2: 23 mmol/L (ref 20–29)
Calcium: 8.9 mg/dL (ref 8.7–10.2)
Chloride: 105 mmol/L (ref 96–106)
Creatinine, Ser: 0.72 mg/dL (ref 0.57–1.00)
Glucose: 82 mg/dL (ref 70–99)
Potassium: 3.9 mmol/L (ref 3.5–5.2)
Sodium: 143 mmol/L (ref 134–144)
eGFR: 101 mL/min/{1.73_m2} (ref >59)

## 2023-04-01 LAB — SARS CORONAVIRUS 2 (TAT 6-24 HRS): SARS Coronavirus 2: NEGATIVE

## 2023-04-18 DIAGNOSIS — H10413 Chronic giant papillary conjunctivitis, bilateral: Secondary | ICD-10-CM | POA: Diagnosis not present

## 2023-04-18 DIAGNOSIS — H40013 Open angle with borderline findings, low risk, bilateral: Secondary | ICD-10-CM | POA: Diagnosis not present

## 2023-05-25 DIAGNOSIS — J209 Acute bronchitis, unspecified: Secondary | ICD-10-CM | POA: Diagnosis not present

## 2023-05-25 DIAGNOSIS — J189 Pneumonia, unspecified organism: Secondary | ICD-10-CM | POA: Diagnosis not present

## 2023-08-21 ENCOUNTER — Ambulatory Visit
Admission: EM | Admit: 2023-08-21 | Discharge: 2023-08-21 | Disposition: A | Discharge status: Home / Self Care | Payer: BC Managed Care – PPO | Attending: Nurse Practitioner | Admitting: Nurse Practitioner

## 2023-08-21 ENCOUNTER — Other Ambulatory Visit: Payer: Self-pay | Admitting: Nurse Practitioner

## 2023-08-21 DIAGNOSIS — J440 Chronic obstructive pulmonary disease with acute lower respiratory infection: Secondary | ICD-10-CM

## 2023-08-21 DIAGNOSIS — J209 Acute bronchitis, unspecified: Secondary | ICD-10-CM

## 2023-08-21 MED ORDER — IPRATROPIUM-ALBUTEROL 0.5-2.5 (3) MG/3ML IN SOLN
3.0000 mL | Freq: Once | RESPIRATORY_TRACT | Status: AC
  Administered 2023-08-21: 3 mL via RESPIRATORY_TRACT

## 2023-08-21 MED ORDER — BENZONATATE 100 MG PO CAPS: 100.0000 mg | ORAL_CAPSULE | Freq: Three times a day (TID) | ORAL | 0 refills | Status: DC | PRN

## 2023-08-21 MED ORDER — PREDNISONE 20 MG PO TABS: 40.0000 mg | ORAL_TABLET | Freq: Every day | ORAL | 0 refills | Status: AC

## 2023-08-21 MED ORDER — AZITHROMYCIN 250 MG PO TABS: 250.0000 mg | ORAL_TABLET | Freq: Every day | ORAL | 0 refills | Status: DC

## 2023-08-21 NOTE — ED Provider Notes (Signed)
[facility]    CSN: [id number] Arrival date & time: 08/21/23  1357      History   Chief Complaint No chief complaint on file.   HPI Andrea Daugherty is a 53 y.o. female.   Patient presents today with 1 week history of chest tightness, wheezing, shortness of breath, and dry cough with a little bit of mucus production.  She reports woke up yesterday with sore throat, stuffy nose, body aches, runny nose, postnasal drainage, sinus pressure in her cheeks, diarrhea, and fatigue.  She reports the diarrhea is not new for her.  No fever or chills, stuffy nose, headache, ear pain, abdominal pain, nausea, or vomiting.  She is eating and drinking normally per her report.  Has been taking ibuprofen and previously prescribed rescue inhaler for symptoms with minimal improvement.  Patient is a current every day smoker.  She uses an inhaler daily as prescribed by her primary care provider.    No past medical history on file.  Patient Active Problem List   Diagnosis Date Noted   Tennis elbow syndrome 07/11/2013   Shoulder pain 08/17/2011   Rotator cuff syndrome of right shoulder 08/17/2011    Past Surgical History:  Procedure Laterality Date   CESAREAN SECTION     DILATION AND CURETTAGE OF UTERUS     NOVASURE ABLATION     TUBAL LIGATION     WISDOM TOOTH EXTRACTION      OB History     Gravida  4   Para  3   Term  3   Preterm      AB  1   Living  3      SAB  1   IAB      Ectopic      Multiple      Live Births               Home Medications    Prior to Admission medications   Medication Sig Start Date End Date Taking? Authorizing Provider  benzonatate (TESSALON) 100 MG capsule Take 1 capsule (100 mg total) by mouth 3 (three) times daily as needed for cough. Do not take with alcohol or while operating or driving heavy machinery 11/01/79  Yes Cathlean Marseilles A, NP  predniSONE (DELTASONE) 20 MG tablet Take 2 tablets (40 mg total) by mouth daily with  breakfast for 5 days. 08/21/23 08/26/23 Yes Valentino Nose, NP  albuterol (VENTOLIN HFA) 108 (90 Base) MCG/ACT inhaler Inhale 2 puffs into the lungs every 4 (four) hours as needed for wheezing or shortness of breath. 10/28/22   Raspet, Noberto Retort, PA-C  azithromycin (ZITHROMAX) 250 MG tablet Take 1 tablet (250 mg total) by mouth daily. Take first 2 tablets together, then 1 every day until finished. 08/21/23   Valentino Nose, NP  budesonide-formoterol (SYMBICORT) 80-4.5 MCG/ACT inhaler Inhale 2 puffs into the lungs in the morning and at bedtime. Rinse mouth out after each use 03/31/23   Raspet, Erin K, PA-C  Ibuprofen 200 MG CAPS Take by mouth.    [provider]  loratadine (CLARITIN) 10 MG tablet Take 10 mg by mouth daily.    [provider]    Family History Family History  Problem Relation Age of Onset   Cancer Mother    Heart failure Father     Social History Social History   Tobacco Use   Smoking status: Every Day    Current packs/day: 0.50    Average packs/day: 0.5  packs/day for 28.0 years (14.0 ttl pk-yrs)    Types: Cigarettes   Smokeless tobacco: Never  Substance Use Topics   Alcohol use: No   Drug use: No     Allergies   Wasp venom and Hydrocodone   Review of Systems Review of Systems Per HPI  Physical Exam Triage Vital Signs ED Triage Vitals  Encounter Vitals Group     BP 08/21/23 1435 (!) 151/90     Systolic BP Percentile --      Diastolic BP Percentile --      Pulse Rate 08/21/23 1435 78     Resp 08/21/23 1435 18     Temp 08/21/23 1435 98.7 F (37.1 C)     Temp Source 08/21/23 1435 Oral     SpO2 08/21/23 1435 94 %     Weight --      Height --      Head Circumference --      Peak Flow --      Pain Score 08/21/23 1438 0     Pain Loc --      Pain Education --      Exclude from Growth Chart --    No data found.  Updated Vital Signs BP 121/84 (BP Location: Right Arm)   Pulse 78   Temp 98.7 F (37.1 C) (Oral)   Resp 18   LMP   (LMP Unknown)   SpO2 97%   Visual Acuity Right Eye Distance:   Left Eye Distance:   Bilateral Distance:    Right Eye Near:   Left Eye Near:    Bilateral Near:     Physical Exam Vitals and nursing note reviewed.  Constitutional:      General: She is not in acute distress.    Appearance: Normal appearance. She is not ill-appearing or toxic-appearing.  HENT:     Head: Normocephalic and atraumatic.     Right Ear: Tympanic membrane, ear canal and external ear normal.     Left Ear: Tympanic membrane, ear canal and external ear normal.     Nose: No congestion or rhinorrhea.     Mouth/Throat:     Mouth: Mucous membranes are moist.     Pharynx: Oropharynx is clear. Posterior oropharyngeal erythema present. No oropharyngeal exudate.  Eyes:     General: No scleral icterus.    Extraocular Movements: Extraocular movements intact.  Cardiovascular:     Rate and Rhythm: Normal rate and regular rhythm.  Pulmonary:     Effort: Pulmonary effort is normal. No tachypnea or respiratory distress.     Breath sounds: Examination of the right-lower field reveals decreased breath sounds. Examination of the left-lower field reveals decreased breath sounds. Decreased breath sounds and wheezing present. No rhonchi or rales.  Musculoskeletal:     Cervical back: Normal range of motion and neck supple.  Lymphadenopathy:     Cervical: No cervical adenopathy.  Skin:    General: Skin is warm and dry.     Coloration: Skin is not jaundiced or pale.     Findings: No erythema or rash.  Neurological:     Mental Status: She is alert and oriented to person, place, and time.  Psychiatric:        Behavior: Behavior is cooperative.      UC Treatments / Results  Labs (all labs ordered are listed, but only abnormal results are displayed) Labs Reviewed - No data to display  EKG   Radiology No results found.  Procedures Procedures (including  critical care time)  Medications Ordered in UC Medications   ipratropium-albuterol (DUONEB) 0.5-2.5 (3) MG/3ML nebulizer solution 3 mL (3 mLs Nebulization Given 08/21/23 1526)    Initial Impression / Assessment and Plan / UC Course  I have reviewed the triage vital signs and the nursing notes.  Pertinent labs & imaging results that were available during my care of the patient were reviewed by me and considered in my medical decision making (see chart for details).   Patient is well-appearing, normotensive, afebrile, not tachycardic, not tachypneic, oxygenating well on room air.    1. Bronchitis, chronic obstructive w acute bronchitis (HCC) Vitals and exam are reassuring After DuoNeb, SpO2 increased to 97% on room air; wheezing improved as well as air movement bilaterally Will treat for exacerbation of chronic bronchitis with oral prednisone, azithromycin, expectorant, cough suppressant medication ER and return precautions discussed with patient Work excuse provided  The patient was given the opportunity to ask questions.  All questions answered to their satisfaction.  The patient is in agreement to this plan.    Final Clinical Impressions(s) / UC Diagnoses   Final diagnoses:  Bronchitis, chronic obstructive w acute bronchitis (HCC)     Discharge Instructions      You are having an exacerbation of your breathing.  Start using albuterol inhaler every 4-6 hours scheduled for the next 48 hours, then use as needed.  Continue daily inhalers previously prescribed.  In addition, start azithromycin to help with lung inflammation, oral prednisone to help with lung inflammation, and take cough Perles as needed for the cough.  Also recommend increasing hydration with plenty of water and guaifenesin 600 milligrams twice daily.  Seek care if symptoms not improve with treatment.     ED Prescriptions     Medication Sig Dispense Auth. Provider   azithromycin (ZITHROMAX) 250 MG tablet Take 1 tablet (250 mg total) by mouth daily. Take first 2 tablets  together, then 1 every day until finished. 6 tablet Cathlean Marseilles A, NP   predniSONE (DELTASONE) 20 MG tablet Take 2 tablets (40 mg total) by mouth daily with breakfast for 5 days. 10 tablet Cathlean Marseilles A, NP   benzonatate (TESSALON) 100 MG capsule Take 1 capsule (100 mg total) by mouth 3 (three) times daily as needed for cough. Do not take with alcohol or while operating or driving heavy machinery 21 capsule Valentino Nose, NP      PDMP not reviewed this encounter.   Valentino Nose, NP 08/21/23 1655

## 2023-08-21 NOTE — Discharge Instructions (Addendum)
You are having an exacerbation of your breathing.  Start using albuterol inhaler every 4-6 hours scheduled for the next 48 hours, then use as needed.  Continue daily inhalers previously prescribed.  In addition, start azithromycin to help with lung inflammation, oral prednisone to help with lung inflammation, and take cough Perles as needed for the cough.  Also recommend increasing hydration with plenty of water and guaifenesin 600 milligrams twice daily.  Seek care if symptoms not improve with treatment.

## 2023-08-21 NOTE — ED Triage Notes (Signed)
Pt reports cough, sore throat, nasal congestion, x 1 day, states she had been experiencing chest discomfort x 1 week

## 2023-08-22 NOTE — Telephone Encounter (Signed)
Called patient to review medication refill request from pharmacy. Patient reports she is no longer being seen at [facility]. Patient does not have a PCP at this time. Reviewed she will need to find a new PCP or go back to UC where she saw Dorann Ou, PA to reorder symbicort. Patient reports she will call back if she needed assist with getting appt for a new PCP.

## 2023-08-22 NOTE — Telephone Encounter (Signed)
Requested by interface surescripts. Provider not at this practice. Patient contacted and reports she does not have a PCP at [facility]. Recommended if Rx needs refills she will need a new PCP or go back to UC . Patient verbalized understanding. Out of medication.  Requested Prescriptions  Refused Prescriptions Disp Refills   budesonide-formoterol (SYMBICORT) 80-4.5 MCG/ACT inhaler [Pharmacy Med Name: BUDESONIDE/FORM 80/4. (120 INH)] 10.2 g     Sig: INHALE 2 PUFFS INTO THE LUNGS IN THE MORNING AND AT BEDTIME. RINSE MOUTH AFTER USE     Pulmonology:  Combination Products Failed - 08/22/2023  1:09 PM      Failed - Valid encounter within last 12 months    Recent Outpatient Visits   None

## 2023-10-02 ENCOUNTER — Ambulatory Visit
Admission: EM | Admit: 2023-10-02 | Discharge: 2023-10-02 | Disposition: A | Discharge status: Home / Self Care | Attending: Nurse Practitioner | Admitting: Nurse Practitioner

## 2023-10-02 DIAGNOSIS — J42 Unspecified chronic bronchitis: Secondary | ICD-10-CM

## 2023-10-02 DIAGNOSIS — Z8709 Personal history of other diseases of the respiratory system: Secondary | ICD-10-CM | POA: Diagnosis not present

## 2023-10-02 LAB — POC COVID19/FLU A&B COMBO
Covid Antigen, POC: NEGATIVE
Influenza A Antigen, POC: NEGATIVE
Influenza B Antigen, POC: NEGATIVE

## 2023-10-02 MED ORDER — ALBUTEROL SULFATE HFA 108 (90 BASE) MCG/ACT IN AERS: 2.0000 | INHALATION_SPRAY | Freq: Four times a day (QID) | RESPIRATORY_TRACT | 0 refills | Status: DC | PRN

## 2023-10-02 MED ORDER — PREDNISONE 20 MG PO TABS: 20.0000 mg | ORAL_TABLET | Freq: Every day | ORAL | 0 refills | Status: AC

## 2023-10-02 MED ORDER — BUDESONIDE-FORMOTEROL FUMARATE 80-4.5 MCG/ACT IN AERO: 2.0000 | INHALATION_SPRAY | Freq: Two times a day (BID) | RESPIRATORY_TRACT | 0 refills | Status: DC

## 2023-10-02 NOTE — ED Triage Notes (Signed)
 Pt reports a bronchitis flare, cough and soreness in the chest x1 day, green colored  phlegm

## 2023-10-02 NOTE — Discharge Instructions (Signed)
 The COVID/flu test was negative. Take medication as prescribed. Increase fluids and allow for plenty of rest. May take over-the-counter Tylenol or ibuprofen as needed for pain, fever, or general discomfort. Continue to work on smoking cessation. Recommend use of a humidifier in your bedroom at nighttime during sleep and sleeping elevated on pillows while symptoms persist. Go to the emergency department immediately if you experience wheezing, shortness of breath, difficulty breathing, or become unable to speak in a complete sentence. Follow-up with a primary care physician for further evaluation. Follow-up as needed.

## 2023-10-02 NOTE — ED Provider Notes (Signed)
 [facility]    CSN: [id number] Arrival date & time: 10/02/23  1348      History   Chief Complaint No chief complaint on file.   HPI Andrea Daugherty is a 53 y.o. female.   The history is provided by the patient.   Patient presents for complaints of cough, soreness in the chest, and green-colored phlegm that started over the past 24 hours.  Patient reports history of chronic bronchitis/COPD.  States that she has been using Symbicort, and albuterol.  Denies fever, chills, headache, ear pain, nasal congestion, wheezing, difficulty breathing, abdominal pain, nausea, vomiting, diarrhea, or rash.  Patient reports that she is still smoking, states "I expect to be done smoking over the next several weeks."  She has not taken any medication for her symptoms. History reviewed. No pertinent past medical history.  Patient Active Problem List   Diagnosis Date Noted   Tennis elbow syndrome 07/11/2013   Shoulder pain 08/17/2011   Rotator cuff syndrome of right shoulder 08/17/2011    Past Surgical History:  Procedure Laterality Date   CESAREAN SECTION     DILATION AND CURETTAGE OF UTERUS     NOVASURE ABLATION     TUBAL LIGATION     WISDOM TOOTH EXTRACTION      OB History     Gravida  4   Para  3   Term  3   Preterm      AB  1   Living  3      SAB  1   IAB      Ectopic      Multiple      Live Births               Home Medications    Prior to Admission medications   Medication Sig Start Date End Date Taking? Authorizing Provider  albuterol (VENTOLIN HFA) 108 (90 Base) MCG/ACT inhaler Inhale 2 puffs into the lungs every 4 (four) hours as needed for wheezing or shortness of breath. 10/28/22   Raspet, Noberto Retort, PA-C  azithromycin (ZITHROMAX) 250 MG tablet Take 1 tablet (250 mg total) by mouth daily. Take first 2 tablets together, then 1 every day until finished. 08/21/23   Valentino Nose, NP  benzonatate (TESSALON) 100 MG capsule Take 1 capsule (100  mg total) by mouth 3 (three) times daily as needed for cough. Do not take with alcohol or while operating or driving heavy machinery 11/01/79   Valentino Nose, NP  budesonide-formoterol (SYMBICORT) 80-4.5 MCG/ACT inhaler Inhale 2 puffs into the lungs in the morning and at bedtime. Rinse mouth out after each use 03/31/23   Raspet, Erin K, PA-C  Ibuprofen 200 MG CAPS Take by mouth.    [provider]  loratadine (CLARITIN) 10 MG tablet Take 10 mg by mouth daily.    [provider]    Family History Family History  Problem Relation Age of Onset   Cancer Mother    Heart failure Father     Social History Social History   Tobacco Use   Smoking status: Every Day    Current packs/day: 0.50    Average packs/day: 0.5 packs/day for 28.0 years (14.0 ttl pk-yrs)    Types: Cigarettes   Smokeless tobacco: Never  Substance Use Topics   Alcohol use: No   Drug use: No     Allergies   Wasp venom and Hydrocodone   Review of Systems Review of Systems Per HPI  Physical  Exam Triage Vital Signs ED Triage Vitals  Encounter Vitals Group     BP 10/02/23 1510 137/89     Systolic BP Percentile --      Diastolic BP Percentile --      Pulse Rate 10/02/23 1510 70     Resp 10/02/23 1510 20     Temp 10/02/23 1510 98.2 F (36.8 C)     Temp Source 10/02/23 1510 Oral     SpO2 --      Weight --      Height --      Head Circumference --      Peak Flow --      Pain Score 10/02/23 1514 0     Pain Loc --      Pain Education --      Exclude from Growth Chart --    No data found.  Updated Vital Signs BP 137/89 (BP Location: Right Arm)   Pulse 70   Temp 98.2 F (36.8 C) (Oral)   Resp 20   LMP  (LMP Unknown)   Visual Acuity Right Eye Distance:   Left Eye Distance:   Bilateral Distance:    Right Eye Near:   Left Eye Near:    Bilateral Near:     Physical Exam Vitals and nursing note reviewed.  Constitutional:      General: She is not in acute distress.     Appearance: Normal appearance.  HENT:     Head: Normocephalic.     Right Ear: Tympanic membrane, ear canal and external ear normal.     Left Ear: Tympanic membrane, ear canal and external ear normal.     Nose: Nose normal.     Mouth/Throat:     Mouth: Mucous membranes are moist.     Pharynx: No posterior oropharyngeal erythema.  Eyes:     Extraocular Movements: Extraocular movements intact.     Conjunctiva/sclera: Conjunctivae normal.     Pupils: Pupils are equal, round, and reactive to light.  Cardiovascular:     Rate and Rhythm: Normal rate and regular rhythm.     Pulses: Normal pulses.     Heart sounds:     Friction rub present.  Pulmonary:     Effort: Pulmonary effort is normal. No respiratory distress.     Breath sounds: Normal breath sounds. No stridor. No wheezing, rhonchi or rales.  Abdominal:     General: Bowel sounds are normal.     Palpations: Abdomen is soft.     Tenderness: There is no abdominal tenderness.  Musculoskeletal:     Cervical back: Normal range of motion.  Skin:    General: Skin is warm and dry.  Neurological:     General: No focal deficit present.     Mental Status: She is alert and oriented to person, place, and time.  Psychiatric:        Mood and Affect: Mood normal.        Behavior: Behavior normal.      UC Treatments / Results  Labs (all labs ordered are listed, but only abnormal results are displayed) Labs Reviewed  POC COVID19/FLU A&B COMBO    EKG   Radiology No results found.  Procedures Procedures (including critical care time)  Medications Ordered in UC Medications - No data to display  Initial Impression / Assessment and Plan / UC Course  I have reviewed the triage vital signs and the nursing notes.  Pertinent labs & imaging results that were available during  my care of the patient were reviewed by me and considered in my medical decision making (see chart for details).  COVID/flu test was negative.  On exam, lung  sounds are clear throughout, room air sats at 96%.  Patient with underlying history of chronic bronchitis/COPD.  Will treat with prednisone 20 mg for possible bronchial inflammation.  Symbicort 80-4.5 mcg prescribed along with an albuterol inhaler.  Patient encouraged to continue working on smoking cessation.  Patient was advised that it is recommended that she establish care with a PCP for continued treatment of her COPD or for referral to pulmonology.  Patient was given strict ER follow-up precautions.  Patient was in agreement with this plan of care and verbalized understanding.  All questions were answered.  Patient stable for discharge.  Work note was provided.  Final Clinical Impressions(s) / UC Diagnoses   Final diagnoses:  None   Discharge Instructions   None    ED Prescriptions   None    PDMP not reviewed this encounter.   Abran Cantor, NP 10/02/23 [phone]

## 2023-12-10 ENCOUNTER — Other Ambulatory Visit: Payer: Self-pay

## 2023-12-10 ENCOUNTER — Emergency Department (HOSPITAL_COMMUNITY)
Admission: EM | Admit: 2023-12-10 | Discharge: 2023-12-10 | Disposition: A | Discharge status: Home / Self Care | Attending: Emergency Medicine | Admitting: Emergency Medicine

## 2023-12-10 ENCOUNTER — Encounter (HOSPITAL_COMMUNITY): Payer: Self-pay

## 2023-12-10 ENCOUNTER — Emergency Department (HOSPITAL_COMMUNITY)

## 2023-12-10 DIAGNOSIS — Z720 Tobacco use: Secondary | ICD-10-CM | POA: Diagnosis not present

## 2023-12-10 DIAGNOSIS — R079 Chest pain, unspecified: Secondary | ICD-10-CM | POA: Diagnosis not present

## 2023-12-10 DIAGNOSIS — R0789 Other chest pain: Secondary | ICD-10-CM | POA: Insufficient documentation

## 2023-12-10 DIAGNOSIS — E876 Hypokalemia: Secondary | ICD-10-CM | POA: Insufficient documentation

## 2023-12-10 DIAGNOSIS — R062 Wheezing: Secondary | ICD-10-CM | POA: Diagnosis not present

## 2023-12-10 HISTORY — DX: Lymphocytic colitis: K52.832

## 2023-12-10 LAB — BASIC METABOLIC PANEL WITH GFR
Anion gap: 6 (ref 5–15)
BUN: 9 mg/dL (ref 6–20)
CO2: 28 mmol/L (ref 22–32)
Calcium: 8.7 mg/dL — ABNORMAL LOW (ref 8.9–10.3)
Chloride: 103 mmol/L (ref 98–111)
Creatinine, Ser: 0.67 mg/dL (ref 0.44–1.00)
GFR, Estimated: >60 mL/min (ref >60)
Glucose, Bld: 92 mg/dL (ref 70–99)
Potassium: 3.4 mmol/L — ABNORMAL LOW (ref 3.5–5.1)
Sodium: 137 mmol/L (ref 135–145)

## 2023-12-10 LAB — CBC
HCT: 37.8 % (ref 36.0–46.0)
Hemoglobin: 13 g/dL (ref 12.0–15.0)
MCH: 32.9 pg (ref 26.0–34.0)
MCHC: 34.4 g/dL (ref 30.0–36.0)
MCV: 95.7 fL (ref 80.0–100.0)
Platelets: 267 10*3/uL (ref 150–400)
RBC: 3.95 MIL/uL (ref 3.87–5.11)
RDW: 11.9 % (ref 11.5–15.5)
WBC: 8.8 10*3/uL (ref 4.0–10.5)
nRBC: 0 % (ref 0.0–0.2)

## 2023-12-10 LAB — TROPONIN I (HIGH SENSITIVITY): Troponin I (High Sensitivity): 4 ng/L (ref <18)

## 2023-12-10 MED ORDER — KETOROLAC TROMETHAMINE 30 MG/ML IJ SOLN: 15.0000 mg | Freq: Once | INTRAMUSCULAR | Status: DC

## 2023-12-10 MED ORDER — NAPROXEN 500 MG PO TABS: 500.0000 mg | ORAL_TABLET | Freq: Two times a day (BID) | ORAL | 0 refills | Status: DC

## 2023-12-10 MED ORDER — BUDESONIDE-FORMOTEROL FUMARATE 80-4.5 MCG/ACT IN AERO: 2.0000 | INHALATION_SPRAY | Freq: Two times a day (BID) | RESPIRATORY_TRACT | 0 refills | Status: DC

## 2023-12-10 MED ORDER — KETOROLAC TROMETHAMINE 30 MG/ML IJ SOLN
15.0000 mg | Freq: Once | INTRAMUSCULAR | Status: AC
  Administered 2023-12-10: 15 mg via INTRAMUSCULAR
  Filled 2023-12-10: qty 1

## 2023-12-10 NOTE — ED Notes (Signed)
 Pt/family received d/c paperwork at this time. After going over the paperwork any questions, comments, or concerns were answered to the best of this nurse's knowledge. The pt/family verbally acknowledged the teachings/instructions.

## 2023-12-10 NOTE — ED Triage Notes (Signed)
 Pt presents with intermittent CP for 4 weeks. Pt describes the pain as sharp and is in the central chest. Pain is reproducible with palpation, movement, and breathing. Pt has had an occasional productive cough with small amounts of clear sputum. She is having some ShOB and is using her albuterol  inhaler daily for the last 2-3 days.

## 2023-12-10 NOTE — ED Provider Notes (Signed)
 [place] [facility] AT [facility]  Provider Note  CSN: [id number] Arrival date & time: 12/10/23 2035  History Chief Complaint  Patient presents with   Chest Pain    Andrea Daugherty is a 53 y.o. female with history of tobacco use and bronchitis as well as colitis, not currently on meds reports several months of intermittent chest soreness, has been to UC x 2 and diagnosed with bronchitis, given inhalers and steroids with improvement. Pain has been worse today, anterior, non radiating worse with movement and deep breath. No cough, SOB or fever. No leg swelling, travel or recent surgery.    Home Medications Prior to Admission medications   Medication Sig Start Date End Date Taking? Authorizing Provider  naproxen (NAPROSYN) 500 MG tablet Take 1 tablet (500 mg total) by mouth 2 (two) times daily. 12/10/23  Yes Charmayne Cooper, MD  albuterol  (VENTOLIN  HFA) 108 [phone] Base) MCG/ACT inhaler Inhale 2 puffs into the lungs every 6 (six) hours as needed. 10/02/23   Leath-Warren, Belen Bowers, NP  benzonatate  (TESSALON ) 100 MG capsule Take 1 capsule (100 mg total) by mouth 3 (three) times daily as needed for cough. Do not take with alcohol or while operating or driving heavy machinery 9/81/19   Wilhemena Harbour, NP  budesonide -formoterol  (SYMBICORT ) 80-4.5 MCG/ACT inhaler Inhale 2 puffs into the lungs 2 (two) times daily. Rinse mouth after each use. 12/10/23   Charmayne Cooper, MD  loratadine (CLARITIN) 10 MG tablet Take 10 mg by mouth daily.    [provider]     Allergies    Wasp venom and Hydrocodone    Review of Systems   Review of Systems Please see HPI for pertinent positives and negatives  Physical Exam BP 115/82   Pulse 81   Temp 98.3 F (36.8 C) (Oral)   Resp 13   Ht 5' 1.5" (1.562 m)   Wt 53.5 kg   LMP  (LMP Unknown)   SpO2 95%   BMI 21.93 kg/m   Physical Exam Vitals and nursing note reviewed.  Constitutional:      Appearance: Normal  appearance.  HENT:     Head: Normocephalic and atraumatic.     Nose: Nose normal.     Mouth/Throat:     Mouth: Mucous membranes are moist.  Eyes:     Extraocular Movements: Extraocular movements intact.     Conjunctiva/sclera: Conjunctivae normal.  Cardiovascular:     Rate and Rhythm: Normal rate.  Pulmonary:     Effort: Pulmonary effort is normal.     Breath sounds: Wheezing present.  Chest:     Chest wall: Tenderness present.  Abdominal:     General: Abdomen is flat.     Palpations: Abdomen is soft.     Tenderness: There is no abdominal tenderness.  Musculoskeletal:        General: No swelling. Normal range of motion.     Cervical back: Neck supple.  Skin:    General: Skin is warm and dry.  Neurological:     General: No focal deficit present.     Mental Status: She is alert.  Psychiatric:        Mood and Affect: Mood normal.     ED Results / Procedures / Treatments   EKG EKG Interpretation Date/Time:  Sunday Dec 10 2023 21:07:57 EDT Ventricular Rate:  85 PR Interval:  116 QRS Duration:  80 QT Interval:  376 QTC Calculation: 447 R Axis:   84  Text Interpretation: Normal sinus rhythm Normal ECG When compared with ECG of 10-Dec-2023 20:58, Sinus rhythm has replaced Ectopic atrial rhythm QRS axis Shifted left Confirmed by Shawnee Dellen [phone]) on 12/10/2023 11:13:44 PM  Procedures Procedures  Medications Ordered in the ED Medications  ketorolac (TORADOL) 30 MG/ML injection 15 mg (has no administration in time range)    Initial Impression and Plan  Patient here with reproducible chest tenderness, not consistent with ACS. No concern for PE. Labs done in triage show unremarkable CBC, BMP and Trop. Given duration of symptom, repeat trop is not necessary. I personally viewed the images from radiology studies and agree with radiologist interpretation: CXR is clear. Will treat for costochondritis, refill of symbicort . PCP follow up, RTED for any other concerns.     ED Course       MDM Rules/Calculators/A&P Medical Decision Making Problems Addressed: Chest wall pain: acute illness or injury  Amount and/or Complexity of Data Reviewed Labs: ordered. Decision-making details documented in ED Course. Radiology: ordered and independent interpretation performed. Decision-making details documented in ED Course. ECG/medicine tests: ordered and independent interpretation performed. Decision-making details documented in ED Course.  Risk Prescription drug management.     Final Clinical Impression(s) / ED Diagnoses Final diagnoses:  Chest wall pain    Rx / DC Orders ED Discharge Orders          Ordered    budesonide -formoterol  (SYMBICORT ) 80-4.5 MCG/ACT inhaler  2 times daily        12/10/23 2328    naproxen (NAPROSYN) 500 MG tablet  2 times daily        12/10/23 2328             Charmayne Cooper, MD 12/10/23 2328

## 2024-01-24 ENCOUNTER — Encounter: Payer: Self-pay | Admitting: Emergency Medicine

## 2024-01-24 ENCOUNTER — Ambulatory Visit
Admission: EM | Admit: 2024-01-24 | Discharge: 2024-01-24 | Disposition: A | Discharge status: Home / Self Care | Attending: Family Medicine | Admitting: Family Medicine

## 2024-01-24 DIAGNOSIS — J441 Chronic obstructive pulmonary disease with (acute) exacerbation: Secondary | ICD-10-CM | POA: Diagnosis not present

## 2024-01-24 DIAGNOSIS — J069 Acute upper respiratory infection, unspecified: Secondary | ICD-10-CM

## 2024-01-24 MED ORDER — PREDNISONE 20 MG PO TABS: 40.0000 mg | ORAL_TABLET | Freq: Every day | ORAL | 0 refills | Status: DC

## 2024-01-24 MED ORDER — ALBUTEROL SULFATE HFA 108 (90 BASE) MCG/ACT IN AERS: 2.0000 | INHALATION_SPRAY | RESPIRATORY_TRACT | 0 refills | Status: DC | PRN

## 2024-01-24 MED ORDER — BUDESONIDE-FORMOTEROL FUMARATE 80-4.5 MCG/ACT IN AERO: 2.0000 | INHALATION_SPRAY | Freq: Two times a day (BID) | RESPIRATORY_TRACT | 0 refills | Status: DC

## 2024-01-24 MED ORDER — BENZONATATE 200 MG PO CAPS: 200.0000 mg | ORAL_CAPSULE | Freq: Three times a day (TID) | ORAL | 0 refills | Status: DC | PRN

## 2024-01-24 NOTE — ED Provider Notes (Signed)
 [facility]    CSN: [id number] Arrival date & time: 01/24/24  1339      History   Chief Complaint No chief complaint on file.   HPI Andrea Daugherty is a 53 y.o. female.   Patient presenting today with 2-day history of sneezing, runny nose, aches in the neck and shoulders, dry hacking cough, wheezing, chest tightness.  Denies fever, chest pain, shortness of breath, abdominal pain, vomiting, diarrhea.  So far trying over-the-counter pain relievers with minimal relief.  History of COPD typically on Symbicort  and as needed albuterol  but has been out of her inhalers for about 3 days.  No known sick contacts recently.    Past Medical History:  Diagnosis Date   Lymphocytic colitis     Patient Active Problem List   Diagnosis Date Noted   Tennis elbow syndrome 07/11/2013   Shoulder pain 08/17/2011   Rotator cuff syndrome of right shoulder 08/17/2011    Past Surgical History:  Procedure Laterality Date   CESAREAN SECTION     DILATION AND CURETTAGE OF UTERUS     NOVASURE ABLATION     TUBAL LIGATION     WISDOM TOOTH EXTRACTION      OB History     Gravida  4   Para  3   Term  3   Preterm      AB  1   Living  3      SAB  1   IAB      Ectopic      Multiple      Live Births               Home Medications    Prior to Admission medications   Medication Sig Start Date End Date Taking? Authorizing Provider  benzonatate  (TESSALON ) 200 MG capsule Take 1 capsule (200 mg total) by mouth 3 (three) times daily as needed for cough. 01/24/24  Yes Stuart Vernell Norris, PA-C  predniSONE  (DELTASONE ) 20 MG tablet Take 2 tablets (40 mg total) by mouth daily with breakfast. 01/24/24  Yes Stuart Vernell Norris, PA-C  albuterol  (VENTOLIN  HFA) 108 (90 Base) MCG/ACT inhaler Inhale 2 puffs into the lungs every 4 (four) hours as needed. 01/24/24   Stuart Vernell Norris, PA-C  budesonide -formoterol  (SYMBICORT ) 80-4.5 MCG/ACT inhaler Inhale 2 puffs into the lungs 2  (two) times daily. Rinse mouth after each use. 01/24/24   Stuart Vernell Norris, PA-C    Family History Family History  Problem Relation Age of Onset   Cancer Mother    Heart failure Father     Social History Social History   Tobacco Use   Smoking status: Every Day    Current packs/day: 0.50    Average packs/day: 0.5 packs/day for 28.0 years (14.0 ttl pk-yrs)    Types: Cigarettes   Smokeless tobacco: Never  Vaping Use   Vaping status: Never Used  Substance Use Topics   Alcohol use: No   Drug use: No     Allergies   Wasp venom and Hydrocodone    Review of Systems Review of Systems Per HPI  Physical Exam Triage Vital Signs ED Triage Vitals  Encounter Vitals Group     BP 01/24/24 1347 126/80     Girls Systolic BP Percentile --      Girls Diastolic BP Percentile --      Boys Systolic BP Percentile --      Boys Diastolic BP Percentile --      Pulse Rate 01/24/24 1347  68     Resp 01/24/24 1347 18     Temp 01/24/24 1347 98.2 F (36.8 C)     Temp Source 01/24/24 1347 Oral     SpO2 01/24/24 1347 96 %     Weight --      Height --      Head Circumference --      Peak Flow --      Pain Score 01/24/24 1348 4     Pain Loc --      Pain Education --      Exclude from Growth Chart --    No data found.  Updated Vital Signs BP 126/80 (BP Location: Right Arm)   Pulse 68   Temp 98.2 F (36.8 C) (Oral)   Resp 18   LMP  (LMP Unknown)   SpO2 96%   Visual Acuity Right Eye Distance:   Left Eye Distance:   Bilateral Distance:    Right Eye Near:   Left Eye Near:    Bilateral Near:     Physical Exam Vitals and nursing note reviewed.  Constitutional:      Appearance: Normal appearance.  HENT:     Head: Atraumatic.     Right Ear: Tympanic membrane and external ear normal.     Left Ear: Tympanic membrane and external ear normal.     Nose: Rhinorrhea present.     Mouth/Throat:     Mouth: Mucous membranes are moist.     Pharynx: No posterior oropharyngeal  erythema.  Eyes:     Extraocular Movements: Extraocular movements intact.     Conjunctiva/sclera: Conjunctivae normal.  Cardiovascular:     Rate and Rhythm: Normal rate and regular rhythm.     Heart sounds: Normal heart sounds.  Pulmonary:     Effort: Pulmonary effort is normal.     Breath sounds: Wheezing present. No rales.     Comments: Trace wheezes bilateral bases Musculoskeletal:        General: Normal range of motion.     Cervical back: Normal range of motion and neck supple.  Skin:    General: Skin is warm and dry.  Neurological:     Mental Status: She is alert and oriented to person, place, and time.  Psychiatric:        Mood and Affect: Mood normal.        Thought Content: Thought content normal.      UC Treatments / Results  Labs (all labs ordered are listed, but only abnormal results are displayed) Labs Reviewed - No data to display  EKG   Radiology No results found.  Procedures Procedures (including critical care time)  Medications Ordered in UC Medications - No data to display  Initial Impression / Assessment and Plan / UC Course  I have reviewed the triage vital signs and the nursing notes.  Pertinent labs & imaging results that were available during my care of the patient were reviewed by me and considered in my medical decision making (see chart for details).     Vitals and exam overall reassuring, suspicious for viral respiratory infection with COPD exacerbation.  Will refill Symbicort  and albuterol  and prescribe a short course of prednisone , Tessalon  for to help with new symptoms.  Supportive home care and return precautions reviewed.  Final Clinical Impressions(s) / UC Diagnoses   Final diagnoses:  Viral URI with cough  COPD exacerbation [facility])   Discharge Instructions   None    ED Prescriptions  Medication Sig Dispense Auth. Provider   budesonide -formoterol  (SYMBICORT ) 80-4.5 MCG/ACT inhaler Inhale 2 puffs into the lungs 2 (two)  times daily. Rinse mouth after each use. 1 each Stuart Vernell Norris, PA-C   albuterol  (VENTOLIN  HFA) 108 (90 Base) MCG/ACT inhaler Inhale 2 puffs into the lungs every 4 (four) hours as needed. 18 g Stuart Vernell Norris, PA-C   predniSONE  (DELTASONE ) 20 MG tablet Take 2 tablets (40 mg total) by mouth daily with breakfast. 10 tablet Stuart Vernell Norris, PA-C   benzonatate  (TESSALON ) 200 MG capsule Take 1 capsule (200 mg total) by mouth 3 (three) times daily as needed for cough. 20 capsule Stuart Vernell Norris, [place]      PDMP not reviewed this encounter.   Stuart Vernell Norris, [place] 01/24/24 [phone]

## 2024-01-24 NOTE — ED Triage Notes (Signed)
 Sneezing and runny nose x 2 days.  States she has inflammation in chest and back of shoulders with a dry cough x 1 week.  States she is our of her inhalers

## 2024-01-29 ENCOUNTER — Ambulatory Visit
Admission: EM | Admit: 2024-01-29 | Discharge: 2024-01-29 | Disposition: A | Discharge status: Home / Self Care | Source: Ambulatory Visit | Attending: Family Medicine | Admitting: Family Medicine

## 2024-01-29 DIAGNOSIS — J441 Chronic obstructive pulmonary disease with (acute) exacerbation: Secondary | ICD-10-CM | POA: Diagnosis not present

## 2024-01-29 MED ORDER — BUDESONIDE-FORMOTEROL FUMARATE 80-4.5 MCG/ACT IN AERO: 2.0000 | INHALATION_SPRAY | Freq: Two times a day (BID) | RESPIRATORY_TRACT | 0 refills | Status: DC

## 2024-01-29 NOTE — ED Triage Notes (Signed)
 Pt reports cough, congestion sates she was seen last week for same thing but hewr job is requiring an additional work note.

## 2024-01-29 NOTE — ED Provider Notes (Signed)
 [facility]    CSN: [id number] Arrival date & time: 01/29/24  1635      History   Chief Complaint No chief complaint on file.   HPI Andrea Daugherty is a 53 y.o. female.   Patient presenting today with ongoing cough, congestion from visit last week for upper respiratory infection and COPD exacerbation.  Completed prednisone  and has been using albuterol  as needed, states pharmacy did not have Symbicort  inhaler that was prescribed prior visit.  Denies chest pain, shortness of breath, abdominal pain, vomiting, diarrhea.  Requesting a work note as she missed work today.    Past Medical History:  Diagnosis Date   Lymphocytic colitis     Patient Active Problem List   Diagnosis Date Noted   Tennis elbow syndrome 07/11/2013   Shoulder pain 08/17/2011   Rotator cuff syndrome of right shoulder 08/17/2011    Past Surgical History:  Procedure Laterality Date   CESAREAN SECTION     DILATION AND CURETTAGE OF UTERUS     NOVASURE ABLATION     TUBAL LIGATION     WISDOM TOOTH EXTRACTION      OB History     Gravida  4   Para  3   Term  3   Preterm      AB  1   Living  3      SAB  1   IAB      Ectopic      Multiple      Live Births               Home Medications    Prior to Admission medications   Medication Sig Start Date End Date Taking? Authorizing Provider  albuterol  (VENTOLIN  HFA) 108 (90 Base) MCG/ACT inhaler Inhale 2 puffs into the lungs every 4 (four) hours as needed. 01/24/24   Stuart Vernell Norris, PA-C  benzonatate  (TESSALON ) 200 MG capsule Take 1 capsule (200 mg total) by mouth 3 (three) times daily as needed for cough. 01/24/24   Stuart Vernell Norris, PA-C  budesonide -formoterol  (SYMBICORT ) 80-4.5 MCG/ACT inhaler Inhale 2 puffs into the lungs 2 (two) times daily. Rinse mouth after each use. 01/29/24   Stuart Vernell Norris, PA-C  predniSONE  (DELTASONE ) 20 MG tablet Take 2 tablets (40 mg total) by mouth daily with breakfast. 01/24/24    Stuart Vernell Norris, PA-C    Family History Family History  Problem Relation Age of Onset   Cancer Mother    Heart failure Father     Social History Social History   Tobacco Use   Smoking status: Every Day    Current packs/day: 0.50    Average packs/day: 0.5 packs/day for 28.0 years (14.0 ttl pk-yrs)    Types: Cigarettes   Smokeless tobacco: Never  Vaping Use   Vaping status: Never Used  Substance Use Topics   Alcohol use: No   Drug use: No     Allergies   Wasp venom and Hydrocodone    Review of Systems Review of Systems Per HPI  Physical Exam Triage Vital Signs ED Triage Vitals  Encounter Vitals Group     BP 01/29/24 1655 125/80     Girls Systolic BP Percentile --      Girls Diastolic BP Percentile --      Boys Systolic BP Percentile --      Boys Diastolic BP Percentile --      Pulse Rate 01/29/24 1655 95     Resp 01/29/24 1655 18  Temp 01/29/24 1655 98.5 F (36.9 C)     Temp Source 01/29/24 1655 Oral     SpO2 --      Weight --      Height --      Head Circumference --      Peak Flow --      Pain Score 01/29/24 1657 0     Pain Loc --      Pain Education --      Exclude from Growth Chart --    No data found.  Updated Vital Signs BP 125/80 (BP Location: Right Arm)   Pulse 95   Temp 98.5 F (36.9 C) (Oral)   Resp 18   LMP  (LMP Unknown)   Visual Acuity Right Eye Distance:   Left Eye Distance:   Bilateral Distance:    Right Eye Near:   Left Eye Near:    Bilateral Near:     Physical Exam Vitals and nursing note reviewed.  Constitutional:      Appearance: Normal appearance.  HENT:     Head: Atraumatic.     Right Ear: Tympanic membrane and external ear normal.     Left Ear: Tympanic membrane and external ear normal.     Nose: Rhinorrhea present.     Mouth/Throat:     Mouth: Mucous membranes are moist.     Pharynx: No posterior oropharyngeal erythema.  Eyes:     Extraocular Movements: Extraocular movements intact.      Conjunctiva/sclera: Conjunctivae normal.  Cardiovascular:     Rate and Rhythm: Normal rate and regular rhythm.     Heart sounds: Normal heart sounds.  Pulmonary:     Effort: Pulmonary effort is normal.     Breath sounds: Normal breath sounds. No wheezing or rales.  Musculoskeletal:        General: Normal range of motion.     Cervical back: Normal range of motion and neck supple.  Skin:    General: Skin is warm and dry.  Neurological:     Mental Status: She is alert and oriented to person, place, and time.  Psychiatric:        Mood and Affect: Mood normal.        Thought Content: Thought content normal.      UC Treatments / Results  Labs (all labs ordered are listed, but only abnormal results are displayed) Labs Reviewed - No data to display  EKG   Radiology No results found.  Procedures Procedures (including critical care time)  Medications Ordered in UC Medications - No data to display  Initial Impression / Assessment and Plan / UC Course  I have reviewed the triage vital signs and the nursing notes.  Pertinent labs & imaging results that were available during my care of the patient were reviewed by me and considered in my medical decision making (see chart for details).     Ongoing mild COPD exacerbation.  Overall vitals and exam reassuring today.  Oxygen saturation 94% on room air.  Will resend Symbicort  prescription as pharmacy did not receive for some reason when sent at prior visit.  Continue albuterol  as needed, discussed supportive over-the-counter medications and home care.  Return for worsening symptoms.  Final Clinical Impressions(s) / UC Diagnoses   Final diagnoses:  COPD exacerbation [facility])   Discharge Instructions   None    ED Prescriptions     Medication Sig Dispense Auth. Provider   budesonide -formoterol  (SYMBICORT ) 80-4.5 MCG/ACT inhaler Inhale 2 puffs into the  lungs 2 (two) times daily. Rinse mouth after each use. 1 each Stuart Vernell Norris, PA-C      PDMP not reviewed this encounter.   Stuart Vernell Norris, [place] 01/29/24 1816

## 2024-02-12 ENCOUNTER — Ambulatory Visit
Admission: EM | Admit: 2024-02-12 | Discharge: 2024-02-12 | Disposition: A | Discharge status: Home / Self Care | Attending: Family Medicine | Admitting: Family Medicine

## 2024-02-12 DIAGNOSIS — R1010 Upper abdominal pain, unspecified: Secondary | ICD-10-CM

## 2024-02-12 DIAGNOSIS — J029 Acute pharyngitis, unspecified: Secondary | ICD-10-CM

## 2024-02-12 DIAGNOSIS — R051 Acute cough: Secondary | ICD-10-CM

## 2024-02-12 MED ORDER — AZELASTINE HCL 0.1 % NA SOLN: 1.0000 | Freq: Two times a day (BID) | NASAL | 0 refills | Status: DC

## 2024-02-12 MED ORDER — LIDOCAINE VISCOUS HCL 2 % MT SOLN: 10.0000 mL | OROMUCOSAL | 0 refills | Status: DC | PRN

## 2024-02-12 NOTE — ED Triage Notes (Signed)
 Pt reports abdominal pain nausea and vomiting x 1 day

## 2024-02-13 NOTE — ED Provider Notes (Signed)
 [facility]    CSN: [id number] Arrival date & time: 02/12/24  1325      History   Chief Complaint No chief complaint on file.   HPI Andrea Daugherty is a 53 y.o. female.   Presenting today with 1 day history of generalized abdominal pain, nausea, vomiting.  Denies hematemesis, melena, hematochezia, fever, chills, body aches, new foods or medications, recent sick contacts.  Also having some ongoing congestion and sore throat, cough.  So far not tried anything over-the-counter for symptoms.  States she frequently gets colitis that feels the same as this abdominal pain and is usually self-limiting.    Past Medical History:  Diagnosis Date   Lymphocytic colitis     Patient Active Problem List   Diagnosis Date Noted   Tennis elbow syndrome 07/11/2013   Shoulder pain 08/17/2011   Rotator cuff syndrome of right shoulder 08/17/2011    Past Surgical History:  Procedure Laterality Date   CESAREAN SECTION     DILATION AND CURETTAGE OF UTERUS     NOVASURE ABLATION     TUBAL LIGATION     WISDOM TOOTH EXTRACTION      OB History     Gravida  4   Para  3   Term  3   Preterm      AB  1   Living  3      SAB  1   IAB      Ectopic      Multiple      Live Births               Home Medications    Prior to Admission medications   Medication Sig Start Date End Date Taking? Authorizing Provider  azelastine  (ASTELIN ) 0.1 % nasal spray Place 1 spray into both nostrils 2 (two) times daily. Use in each nostril as directed 02/12/24  Yes Stuart Vernell Norris, PA-C  lidocaine  (XYLOCAINE ) 2 % solution Use as directed 10 mLs in the mouth or throat every 3 (three) hours as needed. 02/12/24  Yes Stuart Vernell Norris, PA-C  albuterol  (VENTOLIN  HFA) 108 (90 Base) MCG/ACT inhaler Inhale 2 puffs into the lungs every 4 (four) hours as needed. 01/24/24   Stuart Vernell Norris, PA-C  benzonatate  (TESSALON ) 200 MG capsule Take 1 capsule (200 mg total) by mouth 3  (three) times daily as needed for cough. 01/24/24   Stuart Vernell Norris, PA-C  budesonide -formoterol  (SYMBICORT ) 80-4.5 MCG/ACT inhaler Inhale 2 puffs into the lungs 2 (two) times daily. Rinse mouth after each use. 01/29/24   Stuart Vernell Norris, PA-C  predniSONE  (DELTASONE ) 20 MG tablet Take 2 tablets (40 mg total) by mouth daily with breakfast. 01/24/24   Stuart Vernell Norris, PA-C    Family History Family History  Problem Relation Age of Onset   Cancer Mother    Heart failure Father     Social History Social History   Tobacco Use   Smoking status: Every Day    Current packs/day: 0.50    Average packs/day: 0.5 packs/day for 28.0 years (14.0 ttl pk-yrs)    Types: Cigarettes   Smokeless tobacco: Never  Vaping Use   Vaping status: Never Used  Substance Use Topics   Alcohol use: No   Drug use: No     Allergies   Wasp venom and Hydrocodone    Review of Systems Review of Systems Per HPI  Physical Exam Triage Vital Signs ED Triage Vitals  Encounter Vitals Group  BP 02/12/24 1330 125/86     Girls Systolic BP Percentile --      Girls Diastolic BP Percentile --      Boys Systolic BP Percentile --      Boys Diastolic BP Percentile --      Pulse Rate 02/12/24 1330 83     Resp 02/12/24 1330 18     Temp 02/12/24 1330 98.4 F (36.9 C)     Temp Source 02/12/24 1330 Oral     SpO2 02/12/24 1330 96 %     Weight --      Height --      Head Circumference --      Peak Flow --      Pain Score 02/12/24 1331 0     Pain Loc --      Pain Education --      Exclude from Growth Chart --    No data found.  Updated Vital Signs BP 125/86 (BP Location: Right Arm)   Pulse 83   Temp 98.4 F (36.9 C) (Oral)   Resp 18   LMP  (LMP Unknown)   SpO2 96%   Visual Acuity Right Eye Distance:   Left Eye Distance:   Bilateral Distance:    Right Eye Near:   Left Eye Near:    Bilateral Near:     Physical Exam Vitals and nursing note reviewed.  Constitutional:      Appearance:  Normal appearance. She is not ill-appearing.  HENT:     Head: Atraumatic.     Nose: Rhinorrhea present.     Mouth/Throat:     Mouth: Mucous membranes are moist.  Eyes:     Extraocular Movements: Extraocular movements intact.     Conjunctiva/sclera: Conjunctivae normal.  Cardiovascular:     Rate and Rhythm: Normal rate and regular rhythm.     Heart sounds: Normal heart sounds.  Pulmonary:     Effort: Pulmonary effort is normal.     Breath sounds: Normal breath sounds.  Abdominal:     General: Bowel sounds are normal. There is no distension.     Palpations: Abdomen is soft.     Tenderness: There is no abdominal tenderness. There is no right CVA tenderness, left CVA tenderness or guarding.  Musculoskeletal:        General: Normal range of motion.     Cervical back: Normal range of motion and neck supple.  Skin:    General: Skin is warm and dry.  Neurological:     Mental Status: She is alert and oriented to person, place, and time.  Psychiatric:        Mood and Affect: Mood normal.        Thought Content: Thought content normal.        Judgment: Judgment normal.      UC Treatments / Results  Labs (all labs ordered are listed, but only abnormal results are displayed) Labs Reviewed - No data to display  EKG   Radiology No results found.  Procedures Procedures (including critical care time)  Medications Ordered in UC Medications - No data to display  Initial Impression / Assessment and Plan / UC Course  I have reviewed the triage vital signs and the nursing notes.  Pertinent labs & imaging results that were available during my care of the patient were reviewed by me and considered in my medical decision making (see chart for details).     Will treat with viscous lidocaine , Astelin  nasal spray for  upper respiratory symptoms, abdominal exam reassuring and she states she deals with this pain frequently and that it typically goes away on its own.  No red flag findings  today.  Continue to monitor, return for worsening symptoms.  Final Clinical Impressions(s) / UC Diagnoses   Final diagnoses:  Sore throat  Acute cough  Upper abdominal pain   Discharge Instructions   None    ED Prescriptions     Medication Sig Dispense Auth. Provider   lidocaine  (XYLOCAINE ) 2 % solution Use as directed 10 mLs in the mouth or throat every 3 (three) hours as needed. 100 mL Stuart Vernell Norris, PA-C   azelastine  (ASTELIN ) 0.1 % nasal spray Place 1 spray into both nostrils 2 (two) times daily. Use in each nostril as directed 30 mL Stuart Vernell Norris, PA-C      PDMP not reviewed this encounter.   Stuart Vernell Norris, [place] 02/13/24 2022

## 2024-02-14 ENCOUNTER — Encounter: Payer: Self-pay | Admitting: Nurse Practitioner

## 2024-02-14 ENCOUNTER — Ambulatory Visit: Payer: Self-pay | Admitting: Nurse Practitioner

## 2024-02-14 VITALS — BP 128/86 | HR 72 | Ht 61.0 in | Wt 118.6 lb

## 2024-02-14 DIAGNOSIS — J209 Acute bronchitis, unspecified: Secondary | ICD-10-CM

## 2024-02-14 DIAGNOSIS — F17200 Nicotine dependence, unspecified, uncomplicated: Secondary | ICD-10-CM | POA: Insufficient documentation

## 2024-02-14 DIAGNOSIS — J440 Chronic obstructive pulmonary disease with acute lower respiratory infection: Secondary | ICD-10-CM

## 2024-02-14 DIAGNOSIS — K529 Noninfective gastroenteritis and colitis, unspecified: Secondary | ICD-10-CM | POA: Diagnosis not present

## 2024-02-14 DIAGNOSIS — F17219 Nicotine dependence, cigarettes, with unspecified nicotine-induced disorders: Secondary | ICD-10-CM | POA: Diagnosis not present

## 2024-02-14 DIAGNOSIS — J449 Chronic obstructive pulmonary disease, unspecified: Secondary | ICD-10-CM | POA: Diagnosis not present

## 2024-02-14 MED ORDER — BUDESONIDE-FORMOTEROL FUMARATE 80-4.5 MCG/ACT IN AERO: 2.0000 | INHALATION_SPRAY | Freq: Two times a day (BID) | RESPIRATORY_TRACT | 11 refills | Status: DC

## 2024-02-14 NOTE — Progress Notes (Addendum)
 New Patient Office Visit  Subjective    Patient ID: Andrea Daugherty, female    DOB: 05-27-1971  Age: 53 y.o. MRN: [id number]  CC:  Chief Complaint  Patient presents with   Establish Care    HPI Andrea Daugherty presents to establish care Patient has PMH of hospitalization 2 months ago, was not admitted but was told had inflammation in the chest.  Smoked her last cigarette this morning.  Has smoked for over 20 years.  Requesting a cardiology and pulmonary referral.  Patient has upcoming work physical and will schedule here along with fasting labwork.  Needs refill on Symbicort .    Outpatient Encounter Medications as of 02/14/2024  Medication Sig   albuterol  (VENTOLIN  HFA) 108 (90 Base) MCG/ACT inhaler Inhale 2 puffs into the lungs every 4 (four) hours as needed.   budesonide -formoterol  (SYMBICORT ) 80-4.5 MCG/ACT inhaler Inhale 2 puffs into the lungs 2 (two) times daily. Rinse mouth after each use.   azelastine  (ASTELIN ) 0.1 % nasal spray Place 1 spray into both nostrils 2 (two) times daily. Use in each nostril as directed   benzonatate  (TESSALON ) 200 MG capsule Take 1 capsule (200 mg total) by mouth 3 (three) times daily as needed for cough. (Patient not taking: Reported on 02/14/2024)   lidocaine  (XYLOCAINE ) 2 % solution Use as directed 10 mLs in the mouth or throat every 3 (three) hours as needed.   predniSONE  (DELTASONE ) 20 MG tablet Take 2 tablets (40 mg total) by mouth daily with breakfast. (Patient not taking: Reported on 02/14/2024)   No facility-administered encounter medications on file as of 02/14/2024.    Past Medical History:  Diagnosis Date   Lymphocytic colitis     Past Surgical History:  Procedure Laterality Date   CESAREAN SECTION     DILATION AND CURETTAGE OF UTERUS     NOVASURE ABLATION     TUBAL LIGATION     WISDOM TOOTH EXTRACTION      Family History  Problem Relation Age of Onset   Cancer Mother    Heart failure Father     Social History    Socioeconomic History   Marital status: Married    Spouse name: Not on file   Number of children: Not on file   Years of education: 12   Highest education level: Not on file  Occupational History    Employer: [employer]  Tobacco Use   Smoking status: Every Day    Current packs/day: 0.50    Average packs/day: 0.5 packs/day for 28.0 years (14.0 ttl pk-yrs)    Types: Cigarettes   Smokeless tobacco: Never  Vaping Use   Vaping status: Never Used  Substance and Sexual Activity   Alcohol use: No   Drug use: No   Sexual activity: Yes    Birth control/protection: Surgical  Other Topics Concern   Not on file  Social History Narrative   Not on file   Social Drivers of Health   Financial Resource Strain: Not on file  Food Insecurity: Not on file  Transportation Needs: Not on file  Physical Activity: Not on file  Stress: Not on file  Social Connections: Not on file  Intimate Partner Violence: Not on file    ROS      Objective    BP 128/86   Pulse 72   Ht 5' 1 (1.549 m)   Wt 118 lb 9.6 oz (53.8 kg)   LMP  (LMP Unknown)   SpO2 98%  BMI 22.41 kg/m   Physical Exam Vitals and nursing note reviewed.  Constitutional:      Appearance: Normal appearance.  HENT:     Head: Normocephalic.     Nose: Nose normal.  Eyes:     Extraocular Movements: Extraocular movements intact.     Pupils: Pupils are equal, round, and reactive to light.  Cardiovascular:     Rate and Rhythm: Normal rate and regular rhythm.  Pulmonary:     Effort: Pulmonary effort is normal.     Breath sounds: Normal breath sounds.  Abdominal:     General: Bowel sounds are normal.     Palpations: Abdomen is soft.  Musculoskeletal:        General: Normal range of motion.     Cervical back: Normal range of motion and neck supple.  Skin:    General: Skin is warm and dry.  Neurological:     Mental Status: She is alert and oriented to person, place, and time.  Psychiatric:        Mood and  Affect: Mood normal.        Behavior: Behavior normal.         Assessment & Plan: 1) Acute bronchitis with COPD 2) Colitis 3) Nicotine dependence, cigarettes  1) Cards referral - cigarette smoking and SOB 2) Pulm referral - cigarette smoking and SOB 3) Physical for work in September and fasting labs   Problem List Items Addressed This Visit   None   No follow-ups on file.   Neale Carpen, NP

## 2024-02-14 NOTE — Patient Instructions (Signed)
 1) Cards referral - cigarette smoking and SOB 2) Pulm referral - cigarette smoking and SOB 3) Physical for work in September and fasting labs

## 2024-02-21 ENCOUNTER — Ambulatory Visit
Admission: EM | Admit: 2024-02-21 | Discharge: 2024-02-21 | Disposition: A | Discharge status: Home / Self Care | Attending: Family Medicine | Admitting: Family Medicine

## 2024-02-21 DIAGNOSIS — J441 Chronic obstructive pulmonary disease with (acute) exacerbation: Secondary | ICD-10-CM

## 2024-02-21 LAB — POC SOFIA SARS ANTIGEN FIA: SARS Coronavirus 2 Ag: NEGATIVE

## 2024-02-21 MED ORDER — BENZONATATE 200 MG PO CAPS: 200.0000 mg | ORAL_CAPSULE | Freq: Three times a day (TID) | ORAL | 0 refills | Status: DC | PRN

## 2024-02-21 NOTE — ED Triage Notes (Signed)
 Pts she has nasal congestion, chest discomfort, cough,. and a severe headache since this morning

## 2024-02-21 NOTE — ED Provider Notes (Signed)
 [facility]    CSN: [id number] Arrival date & time: 02/21/24  1705      History   Chief Complaint No chief complaint on file.   HPI Andrea Daugherty is a 53 y.o. female.   Patient presenting today with 1 day history of headache, sinus drainage, productive cough.  Denies fever, chills, body aches, chest pain, shortness of breath, abdominal pain, vomiting, diarrhea.  So far trying inhaler regimen for COPD with mild temporary benefit.  States her AC has been out for the past few days and she thinks this may be causing her symptoms.    Past Medical History:  Diagnosis Date   Lymphocytic colitis     Patient Active Problem List   Diagnosis Date Noted   COPD (chronic obstructive pulmonary disease) (HCC) 02/14/2024   Colitis 02/14/2024   Nicotine dependence 02/14/2024   Tennis elbow syndrome 07/11/2013   Shoulder pain 08/17/2011   Rotator cuff syndrome of right shoulder 08/17/2011    Past Surgical History:  Procedure Laterality Date   CESAREAN SECTION     DILATION AND CURETTAGE OF UTERUS     NOVASURE ABLATION     TUBAL LIGATION     WISDOM TOOTH EXTRACTION      OB History     Gravida  4   Para  3   Term  3   Preterm      AB  1   Living  3      SAB  1   IAB      Ectopic      Multiple      Live Births               Home Medications    Prior to Admission medications   Medication Sig Start Date End Date Taking? Authorizing Provider  albuterol  (VENTOLIN  HFA) 108 (90 Base) MCG/ACT inhaler Inhale 2 puffs into the lungs every 4 (four) hours as needed. 01/24/24   Stuart Vernell Norris, PA-C  azelastine  (ASTELIN ) 0.1 % nasal spray Place 1 spray into both nostrils 2 (two) times daily. Use in each nostril as directed 02/12/24   Stuart Vernell Norris, PA-C  benzonatate  (TESSALON ) 200 MG capsule Take 1 capsule (200 mg total) by mouth 3 (three) times daily as needed for cough. 02/21/24   Stuart Vernell Norris, PA-C  budesonide -formoterol   (SYMBICORT ) 80-4.5 MCG/ACT inhaler Inhale 2 puffs into the lungs 2 (two) times daily. Rinse mouth after each use. 02/14/24   Boswell, Chelsa, NP  lidocaine  (XYLOCAINE ) 2 % solution Use as directed 10 mLs in the mouth or throat every 3 (three) hours as needed. 02/12/24   Stuart Vernell Norris, PA-C  predniSONE  (DELTASONE ) 20 MG tablet Take 2 tablets (40 mg total) by mouth daily with breakfast. Patient not taking: Reported on 02/14/2024 01/24/24   Stuart Vernell Norris, PA-C    Family History Family History  Problem Relation Age of Onset   Cancer Mother    Heart failure Father     Social History Social History   Tobacco Use   Smoking status: Every Day    Current packs/day: 0.50    Average packs/day: 0.5 packs/day for 28.0 years (14.0 ttl pk-yrs)    Types: Cigarettes   Smokeless tobacco: Never  Vaping Use   Vaping status: Never Used  Substance Use Topics   Alcohol use: No   Drug use: No     Allergies   Wasp venom and Hydrocodone    Review of Systems Review of Systems  Per HPI  Physical Exam Triage Vital Signs ED Triage Vitals  Encounter Vitals Group     BP 02/21/24 1711 124/80     Girls Systolic BP Percentile --      Girls Diastolic BP Percentile --      Boys Systolic BP Percentile --      Boys Diastolic BP Percentile --      Pulse Rate 02/21/24 1711 78     Resp 02/21/24 1711 16     Temp 02/21/24 1711 98.3 F (36.8 C)     Temp Source 02/21/24 1711 Oral     SpO2 02/21/24 1711 96 %     Weight --      Height --      Head Circumference --      Peak Flow --      Pain Score 02/21/24 1712 5     Pain Loc --      Pain Education --      Exclude from Growth Chart --    No data found.  Updated Vital Signs BP 124/80 (BP Location: Right Arm)   Pulse 78   Temp 98.3 F (36.8 C) (Oral)   Resp 16   LMP  (LMP Unknown)   SpO2 96%   Visual Acuity Right Eye Distance:   Left Eye Distance:   Bilateral Distance:    Right Eye Near:   Left Eye Near:    Bilateral Near:      Physical Exam Vitals and nursing note reviewed.  Constitutional:      Appearance: Normal appearance. She is not ill-appearing.  HENT:     Head: Atraumatic.     Right Ear: Tympanic membrane normal.     Left Ear: Tympanic membrane normal.     Nose: Nose normal.     Mouth/Throat:     Mouth: Mucous membranes are moist.     Pharynx: Oropharynx is clear.  Eyes:     Extraocular Movements: Extraocular movements intact.     Conjunctiva/sclera: Conjunctivae normal.  Cardiovascular:     Rate and Rhythm: Normal rate and regular rhythm.     Heart sounds: Normal heart sounds.  Pulmonary:     Effort: Pulmonary effort is normal.     Breath sounds: Normal breath sounds. No wheezing or rales.  Musculoskeletal:        General: Normal range of motion.     Cervical back: Normal range of motion and neck supple.  Skin:    General: Skin is warm and dry.  Neurological:     Mental Status: She is alert and oriented to person, place, and time.  Psychiatric:        Mood and Affect: Mood normal.        Thought Content: Thought content normal.        Judgment: Judgment normal.      UC Treatments / Results  Labs (all labs ordered are listed, but only abnormal results are displayed) Labs Reviewed  POC SOFIA SARS ANTIGEN FIA    EKG   Radiology No results found.  Procedures Procedures (including critical care time)  Medications Ordered in UC Medications - No data to display  Initial Impression / Assessment and Plan / UC Course  I have reviewed the triage vital signs and the nursing notes.  Pertinent labs & imaging results that were available during my care of the patient were reviewed by me and considered in my medical decision making (see chart for details).     Vital  signs and exam very reassuring today, rapid COVID-negative.  Continue COPD regimen, will refill Tessalon  Perles and discussed supportive home care.  Work note given.  Return for worsening symptoms.  Final Clinical  Impressions(s) / UC Diagnoses   Final diagnoses:  COPD exacerbation [facility])   Discharge Instructions   None    ED Prescriptions     Medication Sig Dispense Auth. Provider   benzonatate  (TESSALON ) 200 MG capsule Take 1 capsule (200 mg total) by mouth 3 (three) times daily as needed for cough. 20 capsule Stuart Vernell Norris, [place]      PDMP not reviewed this encounter.   Stuart Vernell Norris, [place] 02/21/24 1759

## 2024-02-28 ENCOUNTER — Ambulatory Visit
Admission: EM | Admit: 2024-02-28 | Discharge: 2024-02-28 | Disposition: A | Discharge status: Home / Self Care | Attending: Nurse Practitioner | Admitting: Nurse Practitioner

## 2024-02-28 DIAGNOSIS — Z8709 Personal history of other diseases of the respiratory system: Secondary | ICD-10-CM

## 2024-02-28 DIAGNOSIS — J069 Acute upper respiratory infection, unspecified: Secondary | ICD-10-CM

## 2024-02-28 MED ORDER — CETIRIZINE HCL 10 MG PO TABS: 10.0000 mg | ORAL_TABLET | Freq: Every day | ORAL | 0 refills | Status: DC

## 2024-02-28 NOTE — ED Triage Notes (Signed)
 Pt reports she has a bad cough that has worsened with mucus x 1 month

## 2024-02-28 NOTE — Discharge Instructions (Signed)
 Take medication as prescribed.  Start the Astelin  nasal spray previously prescribed.  Continue use of your albuterol  inhaler and Symbicort  as previously prescribed.  Also continue the Tessalon  pearls you were prescribed. You may take over-the-counter Tylenol  as needed for pain, fever, or general discomfort. Recommend use of normal saline nasal spray throughout the day for nasal congestion and runny nose. Recommend use of a humidifier in your bedroom at nighttime during sleep and sleeping elevated on pillows while cough symptoms persist. If symptoms do not improve over the next 7 to 10 days, or appear to be worsening, you may follow-up in this clinic or with your primary care physician for further evaluation. Follow-up as needed.

## 2024-02-28 NOTE — ED Provider Notes (Signed)
 [facility]    CSN: [id number] Arrival date & time: 02/28/24  1131      History   Chief Complaint No chief complaint on file.   HPI Andrea Daugherty is a 53 y.o. female.   The history is provided by the patient.   Patient presents for complaints of cough, nasal congestion, and sinus pressure.  States that the cough is productive.  She denies fever, chills, headache, ear pain, dizziness, wheezing, difficulty breathing, chest pain, difficulty breathing, abdominal pain, or rash.  Patient reports that she was seen in this clinic on 02/21/2024 for the same or similar symptoms.  COVID test was negative at that time, she was prescribed Tessalon  for the cough.  States she has been using the Tessalon  but no other medications.  Patient with history of COPD.  States that she is continuing to smoke, states that she has had 2 cigarettes today.  Past Medical History:  Diagnosis Date   Lymphocytic colitis     Patient Active Problem List   Diagnosis Date Noted   COPD (chronic obstructive pulmonary disease) (HCC) 02/14/2024   Colitis 02/14/2024   Nicotine dependence 02/14/2024   Tennis elbow syndrome 07/11/2013   Shoulder pain 08/17/2011   Rotator cuff syndrome of right shoulder 08/17/2011    Past Surgical History:  Procedure Laterality Date   CESAREAN SECTION     DILATION AND CURETTAGE OF UTERUS     NOVASURE ABLATION     TUBAL LIGATION     WISDOM TOOTH EXTRACTION      OB History     Gravida  4   Para  3   Term  3   Preterm      AB  1   Living  3      SAB  1   IAB      Ectopic      Multiple      Live Births               Home Medications    Prior to Admission medications   Medication Sig Start Date End Date Taking? Authorizing Provider  cetirizine  (ZYRTEC ) 10 MG tablet Take 1 tablet (10 mg total) by mouth daily. 02/28/24  Yes Leath-Warren, Etta PARAS, NP  albuterol  (VENTOLIN  HFA) 108 (90 Base) MCG/ACT inhaler Inhale 2 puffs into the lungs every  4 (four) hours as needed. 01/24/24   Stuart Vernell Norris, PA-C  azelastine  (ASTELIN ) 0.1 % nasal spray Place 1 spray into both nostrils 2 (two) times daily. Use in each nostril as directed 02/12/24   Stuart Vernell Norris, PA-C  benzonatate  (TESSALON ) 200 MG capsule Take 1 capsule (200 mg total) by mouth 3 (three) times daily as needed for cough. 02/21/24   Stuart Vernell Norris, PA-C  budesonide -formoterol  (SYMBICORT ) 80-4.5 MCG/ACT inhaler Inhale 2 puffs into the lungs 2 (two) times daily. Rinse mouth after each use. 02/14/24   Boswell, Chelsa, NP  lidocaine  (XYLOCAINE ) 2 % solution Use as directed 10 mLs in the mouth or throat every 3 (three) hours as needed. 02/12/24   Stuart Vernell Norris, PA-C  predniSONE  (DELTASONE ) 20 MG tablet Take 2 tablets (40 mg total) by mouth daily with breakfast. Patient not taking: Reported on 02/14/2024 01/24/24   Stuart Vernell Norris, PA-C    Family History Family History  Problem Relation Age of Onset   Cancer Mother    Heart failure Father     Social History Social History   Tobacco Use   Smoking status:  Every Day    Current packs/day: 0.50    Average packs/day: 0.5 packs/day for 28.0 years (14.0 ttl pk-yrs)    Types: Cigarettes   Smokeless tobacco: Never  Vaping Use   Vaping status: Never Used  Substance Use Topics   Alcohol use: No   Drug use: No     Allergies   Wasp venom and Hydrocodone    Review of Systems Review of Systems Per HPI  Physical Exam Triage Vital Signs ED Triage Vitals  Encounter Vitals Group     BP 02/28/24 1138 123/88     Girls Systolic BP Percentile --      Girls Diastolic BP Percentile --      Boys Systolic BP Percentile --      Boys Diastolic BP Percentile --      Pulse Rate 02/28/24 1138 84     Resp 02/28/24 1138 18     Temp 02/28/24 1138 98.1 F (36.7 C)     Temp Source 02/28/24 1138 Oral     SpO2 02/28/24 1138 94 %     Weight --      Height --      Head Circumference --      Peak Flow --       Pain Score 02/28/24 1139 0     Pain Loc --      Pain Education --      Exclude from Growth Chart --    No data found.  Updated Vital Signs BP 123/88 (BP Location: Right Arm)   Pulse 84   Temp 98.1 F (36.7 C) (Oral)   Resp 18   LMP  (LMP Unknown)   SpO2 94%   Visual Acuity Right Eye Distance:   Left Eye Distance:   Bilateral Distance:    Right Eye Near:   Left Eye Near:    Bilateral Near:     Physical Exam Vitals and nursing note reviewed.  Constitutional:      General: She is not in acute distress.    Appearance: Normal appearance.  HENT:     Head: Normocephalic.     Right Ear: Tympanic membrane, ear canal and external ear normal.     Left Ear: Tympanic membrane, ear canal and external ear normal.     Nose: Congestion present.     Right Turbinates: Enlarged and swollen.     Left Turbinates: Enlarged and swollen.     Right Sinus: No maxillary sinus tenderness or frontal sinus tenderness.     Left Sinus: No maxillary sinus tenderness or frontal sinus tenderness.     Mouth/Throat:     Lips: Pink.     Mouth: Mucous membranes are moist.     Pharynx: Postnasal drip present. No pharyngeal swelling, oropharyngeal exudate, posterior oropharyngeal erythema or uvula swelling.     Comments: Cobblestoning present to posterior oropharynx  Eyes:     Extraocular Movements: Extraocular movements intact.     Conjunctiva/sclera: Conjunctivae normal.     Pupils: Pupils are equal, round, and reactive to light.  Cardiovascular:     Rate and Rhythm: Normal rate and regular rhythm.     Pulses: Normal pulses.     Heart sounds: Normal heart sounds.  Pulmonary:     Effort: Pulmonary effort is normal. No respiratory distress.     Breath sounds: Normal breath sounds. No stridor. No wheezing, rhonchi or rales.  Abdominal:     General: Bowel sounds are normal.     Palpations: Abdomen is soft.  Tenderness: There is no abdominal tenderness.  Musculoskeletal:     Cervical back: Normal  range of motion.  Skin:    General: Skin is warm and dry.  Neurological:     General: No focal deficit present.     Mental Status: She is alert and oriented to person, place, and time.  Psychiatric:        Mood and Affect: Mood normal.        Behavior: Behavior normal.      UC Treatments / Results  Labs (all labs ordered are listed, but only abnormal results are displayed) Labs Reviewed - No data to display  EKG   Radiology No results found.  Procedures Procedures (including critical care time)  Medications Ordered in UC Medications - No data to display  Initial Impression / Assessment and Plan / UC Course  I have reviewed the triage vital signs and the nursing notes.  Pertinent labs & imaging results that were available during my care of the patient were reviewed by me and considered in my medical decision making (see chart for details).  On exam, lung sounds are clear throughout, room air sats at 94%.  No indication for imaging at this time.  Per review of chart, patient was prescribed Astelin  nasal spray on 02/12/2024, states that she did not pick up the medication.  Advised patient to pick up that medication, will also send prescription for cetirizine  10 mg to help with nasal congestion and postnasal drainage.  Supportive care recommendations were provided discussed with the patient to include fluids, rest, over-the-counter analgesics, and use of a humidifier at nighttime during sleep.  Discussed indications with patient regarding follow-up.  Patient was in agreement with this plan of care and verbalizes understanding.  All questions were answered.  Patient stable for discharge.   Final Clinical Impressions(s) / UC Diagnoses   Final diagnoses:  Viral URI with cough     Discharge Instructions      Take medication as prescribed.  Start the Astelin  nasal spray previously prescribed.  Continue use of your albuterol  inhaler and Symbicort  as previously prescribed.  Also  continue the Tessalon  pearls you were prescribed. You may take over-the-counter Tylenol  as needed for pain, fever, or general discomfort. Recommend use of normal saline nasal spray throughout the day for nasal congestion and runny nose. Recommend use of a humidifier in your bedroom at nighttime during sleep and sleeping elevated on pillows while cough symptoms persist. If symptoms do not improve over the next 7 to 10 days, or appear to be worsening, you may follow-up in this clinic or with your primary care physician for further evaluation. Follow-up as needed.     ED Prescriptions     Medication Sig Dispense Auth. Provider   cetirizine  (ZYRTEC ) 10 MG tablet Take 1 tablet (10 mg total) by mouth daily. 30 tablet Leath-Warren, Etta PARAS, NP      PDMP not reviewed this encounter.   Gilmer Etta PARAS, NP 02/28/24 1151

## 2024-03-15 ENCOUNTER — Encounter: Payer: Self-pay | Admitting: Nurse Practitioner

## 2024-03-15 ENCOUNTER — Ambulatory Visit: Admitting: Nurse Practitioner

## 2024-03-15 VITALS — BP 100/68 | HR 69 | Ht 61.0 in | Wt 116.6 lb

## 2024-03-15 DIAGNOSIS — J449 Chronic obstructive pulmonary disease, unspecified: Secondary | ICD-10-CM | POA: Diagnosis not present

## 2024-03-15 DIAGNOSIS — J309 Allergic rhinitis, unspecified: Secondary | ICD-10-CM

## 2024-03-15 DIAGNOSIS — K529 Noninfective gastroenteritis and colitis, unspecified: Secondary | ICD-10-CM | POA: Diagnosis not present

## 2024-03-15 NOTE — Progress Notes (Signed)
   Acute Office Visit  Subjective:     Patient ID: Andrea Daugherty, female    DOB: [birth year], 53 y.o.   MRN: [id number]  Chief Complaint  Patient presents with   Medical Management of Chronic Issues    1 month f/u     HPI Patient is in today for wheezing, colitis discussion, weight loss, PND.  Has been to urgent several times since last visit.  Agrees to pulmonology referral for possible PFTs to include a true diagnosis of pulmonary dysfunction.  Also agrees to GI referral for weight loss, hx of colitis from bx in 2014.  Patient will pick up cetirizine  to help with PND.  ROS      Objective:    BP 100/68   Pulse 69   Ht 5' 1 (1.549 m)   Wt 116 lb 9.6 oz (52.9 kg)   LMP  (LMP Unknown)   SpO2 100%   BMI 22.03 kg/m    Physical Exam Vitals and nursing note reviewed.  Constitutional:      Appearance: Normal appearance.  HENT:     Head: Normocephalic.     Nose: Rhinorrhea present.     Mouth/Throat:     Mouth: Mucous membranes are moist.  Cardiovascular:     Rate and Rhythm: Normal rate and regular rhythm.     Pulses: Normal pulses.     Heart sounds: Normal heart sounds.  Pulmonary:     Effort: Pulmonary effort is normal.  Musculoskeletal:        General: Normal range of motion.     Cervical back: Normal range of motion and neck supple.  Skin:    General: Skin is warm and dry.  Neurological:     Mental Status: She is alert and oriented to person, place, and time.  Psychiatric:        Mood and Affect: Mood normal.        Behavior: Behavior normal.     No results found for any visits on 03/15/24.      Assessment & Plan: 1) Please pick up cetirizine  and take nightly 2) GI referral 3) Pulm referral 4) Oct follow up   Problem List Items Addressed This Visit       Respiratory   COPD (chronic obstructive pulmonary disease) (HCC) - Primary     Digestive   Colitis    No orders of the defined types were placed in this encounter.   No follow-ups on  file.  Neale Carpen, NP

## 2024-03-15 NOTE — Patient Instructions (Signed)
 1) Please pick up cetirizine  and take nightly 2) GI referral 3) Pulm referral 4) Oct follow up

## 2024-03-19 ENCOUNTER — Ambulatory Visit: Payer: Self-pay

## 2024-03-19 ENCOUNTER — Telehealth: Payer: Self-pay

## 2024-03-19 NOTE — Telephone Encounter (Signed)
 Attempt #1 of calling pt mailbox is full  Reason for call documented in pts chart

## 2024-03-19 NOTE — Telephone Encounter (Signed)
 Called pt about he chest pain and per Laura,FNP     she normal EKG in May. It looks like she has an appt with cardiology in November. Recommend she call to see if she can get in with them sooner than November.

## 2024-03-19 NOTE — Telephone Encounter (Signed)
 FYI Only or Action Required?: Action required by provider: request for appointment.  Patient was last seen in primary care on 03/15/2024 by Glennon Sand, NP.  Called Nurse Triage reporting Chest Pain.  Symptoms began several years ago.  Interventions attempted: Nothing.  Symptoms are: unchanged. Seen 03/15/24, discussed chest pain. No availability today. Asking to be worked in.  Triage Disposition: See PCP When Office is Open (Within 3 Days)  Patient/caregiver understands and will follow disposition?: Yes   Copied from CRM #[id number]. Topic: Clinical - Red Word Triage >> Mar 19, 2024  9:08 AM Emylou G wrote: [facility] that prompted transfer to Nurse Triage: chest pains.. inflammation in your chest Reason for Disposition  [1] Chest pain(s) lasting a few seconds AND [2] persists > 3 days  Answer Assessment - Initial Assessment Questions 1. LOCATION: Where does it hurt?       Middle 2. RADIATION: Does the pain go anywhere else? (e.g., into neck, jaw, arms, back)     no 3. ONSET: When did the chest pain begin? (Minutes, hours or days)      Several years 4. PATTERN: Does the pain come and go, or has it been constant since it started?  Does it get worse with exertion?      constant 5. DURATION: How long does it last (e.g., seconds, minutes, hours)     constant 6. SEVERITY: How bad is the pain?  (e.g., Scale 1-10; mild, moderate, or severe)     Now - 4 7. CARDIAC RISK FACTORS: Do you have any history of heart problems or risk factors for heart disease? (e.g., angina, prior heart attack; diabetes, high blood pressure, high cholesterol, smoker, or strong family history of heart disease)     no 8. PULMONARY RISK FACTORS: Do you have any history of lung disease?  (e.g., blood clots in lung, asthma, emphysema, birth control pills)     COPD 9. CAUSE: What do you think is causing the chest pain?     Unsure 10. OTHER SYMPTOMS: Do you have any other symptoms? (e.g.,  dizziness, nausea, vomiting, sweating, fever, difficulty breathing, cough)       no 11. PREGNANCY: Is there any chance you are pregnant? When was your last menstrual period?       no  Protocols used: Chest Pain-A-AH

## 2024-03-28 ENCOUNTER — Ambulatory Visit: Admitting: Family Medicine

## 2024-03-28 ENCOUNTER — Ambulatory Visit: Payer: Self-pay

## 2024-03-28 NOTE — Telephone Encounter (Signed)
 scheduled

## 2024-03-28 NOTE — Telephone Encounter (Signed)
 Patient states that she was advised by her PCP Neale Carpen NP to call the office to be worked in if she feels any worse--patient states that ibuprofen is not helping anymore when she takes it. She states the pain is off and on and worse with palpation or certain movements with no other symptoms present Patient does state that her appointments with Cardiology and Pulmonology are too far out There are no openings with her PCP today or tomorrow at this time This RN advised Urgent Care as an alternative and advised that if she gets worse the Emergency Room. Patient verbalized understanding. Please reach out to patient if able to get her scheduled in the office.  FYI Only or Action Required?: Action required by provider: request for appointment.  Patient was last seen in primary care on 03/15/2024 by Carpen Neale, NP.  Called Nurse Triage reporting Chest Pain.  Symptoms began several years ago.  Interventions attempted: OTC medications: Ibuprofen and Rest, hydration, or home remedies.  Symptoms are: gradually worsening.  Triage Disposition: See PCP When Office is Open (Within 3 Days)  Patient/caregiver understands and will follow disposition?: No, wishes to speak with PCP              Copied from CRM #[id number]. Topic: Clinical - Red Word Triage >> Mar 28, 2024 12:24 PM Delon DASEN wrote: Red Word that prompted transfer to Nurse Triage: chest pain level 6 Reason for Disposition  [1] Chest pain(s) lasting a few seconds AND [2] persists > 3 days  Answer Assessment - Initial Assessment Questions Ibuprofen not helping  Pulmonologist cannot see her until the 24th of this month (September) Cardiology appt November  Prednisone  helps somewhat in the past Pain with palpation, movements   1. LOCATION: Where does it hurt?       Center of chest 2. RADIATION: Does the pain go anywhere else? (e.g., into neck, jaw, arms, back)     Across chest 3. ONSET: When did the chest  pain begin? (Minutes, hours or days)      Years  (since COVID) 4. PATTERN: Does the pain come and go, or has it been constant since it started?  Does it get worse with exertion?      Comes and goes, pain with palpation, 5. DURATION: How long does it last (e.g., seconds, minutes, hours)     constant 6. SEVERITY: How bad is the pain?  (e.g., Scale 1-10; mild, moderate, or severe)     6 7. CARDIAC RISK FACTORS: Do you have any history of heart problems or risk factors for heart disease? (e.g., angina, prior heart attack; diabetes, high blood pressure, high cholesterol, smoker, or strong family history of heart disease)     no 8. PULMONARY RISK FACTORS: Do you have any history of lung disease?  (e.g., blood clots in lung, asthma, emphysema, birth control pills)     COPD 9. CAUSE: What do you think is causing the chest pain?     unknown 10. OTHER SYMPTOMS: Do you have any other symptoms? 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 PREGNANCY: Is there any chance you are pregnant? When was your last menstrual period?       no  Protocols used: Chest Pain-A-AH

## 2024-03-29 ENCOUNTER — Encounter: Payer: Self-pay | Admitting: Nurse Practitioner

## 2024-03-29 ENCOUNTER — Ambulatory Visit: Admitting: Nurse Practitioner

## 2024-03-29 VITALS — BP 111/73 | HR 69 | Resp 16 | Ht 61.0 in | Wt 118.0 lb

## 2024-03-29 DIAGNOSIS — J309 Allergic rhinitis, unspecified: Secondary | ICD-10-CM

## 2024-03-29 DIAGNOSIS — J449 Chronic obstructive pulmonary disease, unspecified: Secondary | ICD-10-CM

## 2024-03-29 DIAGNOSIS — R079 Chest pain, unspecified: Secondary | ICD-10-CM

## 2024-03-29 MED ORDER — HYDROCOD POLI-CHLORPHE POLI ER 10-8 MG/5ML PO SUER: 5.0000 mL | Freq: Two times a day (BID) | ORAL | 0 refills | Status: DC

## 2024-03-29 MED ORDER — AMOXICILLIN-POT CLAVULANATE 875-125 MG PO TABS: 1.0000 | ORAL_TABLET | Freq: Two times a day (BID) | ORAL | 0 refills | Status: DC

## 2024-03-29 MED ORDER — PREDNISONE 50 MG PO TABS: ORAL_TABLET | ORAL | 0 refills | Status: DC

## 2024-03-29 MED ORDER — LORATADINE 10 MG PO TABS: 10.0000 mg | ORAL_TABLET | Freq: Every day | ORAL | 11 refills | Status: DC

## 2024-03-29 NOTE — Addendum Note (Signed)
 Addended by: Savalas Monje H on: 03/29/2024 09:16 AM   Modules accepted: Orders

## 2024-03-29 NOTE — Patient Instructions (Addendum)
 1) COPD exacerbation - pred burst, cont Symbicort  daily, prn as needed albuterol , control cough with water and tussionex, augmentin  high dose for 10 day supply and instead of cetirizine , use loratadine  for seasonal allergies 2) Seasonal allergies  - loratadine  3) Needs work note 4) Follow up appt in 10 days 5) COVID testing today 6) [place] Paperwork needs to be completed per patient for job- done prior to patient leaving office today

## 2024-03-29 NOTE — Progress Notes (Addendum)
 Established Patient Office Visit  Subjective:  Patient ID: Andrea Daugherty, female    DOB: 08/31/1970  Age: 53 y.o. MRN: [id number]  Chief Complaint  Patient presents with   Chest Pain    States she has been having chest pain for about 2 weeks and has been coughing about 3-4 days with clear mucus. States husband has been sick as well    Patient here today for an acute visit, reporting nonproductive cough, wheezing, chest tightness.  Patient feels very fatigued.  Patient is unable to tolerate cetirizine .  Patient wants COVID testing today.  Patient also needs paperwork to be completed, handed information to this provider at office visit today.  Chest Pain     No other concerns at this time.   Past Medical History:  Diagnosis Date   Lymphocytic colitis     Past Surgical History:  Procedure Laterality Date   CESAREAN SECTION     DILATION AND CURETTAGE OF UTERUS     NOVASURE ABLATION     TUBAL LIGATION     WISDOM TOOTH EXTRACTION      Social History   Socioeconomic History   Marital status: Married    Spouse name: Not on file   Number of children: Not on file   Years of education: 12   Highest education level: Not on file  Occupational History    Employer: [employer]  Tobacco Use   Smoking status: Every Day    Current packs/day: 0.50    Average packs/day: 0.5 packs/day for 28.0 years (14.0 ttl pk-yrs)    Types: Cigarettes   Smokeless tobacco: Never  Vaping Use   Vaping status: Never Used  Substance and Sexual Activity   Alcohol use: No   Drug use: No   Sexual activity: Yes    Birth control/protection: Surgical  Other Topics Concern   Not on file  Social History Narrative   Not on file   Social Drivers of Health   Financial Resource Strain: Not on file  Food Insecurity: Not on file  Transportation Needs: Not on file  Physical Activity: Not on file  Stress: Not on file  Social Connections: Not on file  Intimate Partner Violence: Not on file     Family History  Problem Relation Age of Onset   Cancer Mother    Heart failure Father     Allergies  Allergen Reactions   Wasp Venom    Hydrocodone  Rash    Outpatient Medications Prior to Visit  Medication Sig   albuterol  (VENTOLIN  HFA) 108 (90 Base) MCG/ACT inhaler Inhale 2 puffs into the lungs every 4 (four) hours as needed.   budesonide -formoterol  (SYMBICORT ) 80-4.5 MCG/ACT inhaler Inhale 2 puffs into the lungs 2 (two) times daily. Rinse mouth after each use. (Patient not taking: Reported on 03/29/2024)   [DISCONTINUED] azelastine  (ASTELIN ) 0.1 % nasal spray Place 1 spray into both nostrils 2 (two) times daily. Use in each nostril as directed   [DISCONTINUED] benzonatate  (TESSALON ) 200 MG capsule Take 1 capsule (200 mg total) by mouth 3 (three) times daily as needed for cough.   [DISCONTINUED] cetirizine  (ZYRTEC ) 10 MG tablet Take 1 tablet (10 mg total) by mouth daily. (Patient not taking: Reported on 03/29/2024)   [DISCONTINUED] lidocaine  (XYLOCAINE ) 2 % solution Use as directed 10 mLs in the mouth or throat every 3 (three) hours as needed.   [DISCONTINUED] predniSONE  (DELTASONE ) 20 MG tablet Take 2 tablets (40 mg total) by mouth daily with breakfast.  No facility-administered medications prior to visit.    Review of Systems  Cardiovascular:  Positive for chest pain.       Objective:   BP 111/73   Pulse 69   Resp 16   Ht 5' 1 (1.549 m)   Wt 118 lb (53.5 kg)   LMP  (LMP Unknown)   SpO2 100%   BMI 22.30 kg/m   Vitals:   03/29/24 0830  BP: 111/73  Pulse: 69  Resp: 16  Height: 5' 1 (1.549 m)  Weight: 118 lb (53.5 kg)  SpO2: 100%  BMI (Calculated): 22.31    Physical Exam Vitals and nursing note reviewed.  Constitutional:      Appearance: She is well-developed.  HENT:     Head: Normocephalic.  Cardiovascular:     Rate and Rhythm: Normal rate and regular rhythm.     Heart sounds: Normal heart sounds.  Pulmonary:     Breath sounds: Examination of the  right-upper field reveals wheezing and rhonchi. Examination of the left-upper field reveals wheezing and rhonchi. Examination of the right-lower field reveals wheezing and rhonchi. Examination of the left-lower field reveals wheezing and rhonchi. Wheezing and rhonchi present.  Musculoskeletal:        General: Normal range of motion.     Cervical back: Normal range of motion and neck supple.  Skin:    General: Skin is warm and dry.  Neurological:     Mental Status: She is alert and oriented to person, place, and time.  Psychiatric:        Mood and Affect: Mood normal.        Behavior: Behavior normal.      No results found for any visits on 03/29/24.  Recent Results (from the past 2160 hours)  POC SARS Coronavirus 2 Ag     Status: None   Collection Time: 02/21/24  5:17 PM  Result Value Ref Range   SARS Coronavirus 2 Ag Negative Negative      Assessment & Plan: 1) COPD exacerbation - pred burst, cont Symbicort  daily, prn as needed albuterol , control cough with water and tussionex, augmentin  high dose for 10 day supply and instead of cetirizine , use loratadine  for seasonal allergies 2) Seasonal allergies  - loratadine  3) Needs work note 4) Follow up appt in 10 days 5) COVID testing today   Problem List Items Addressed This Visit       Respiratory   COPD (chronic obstructive pulmonary disease) (HCC)   Allergic rhinitis     Other   Chest pain - Primary    No follow-ups on file.   Total time spent: 25 minutes  Neale Carpen, NP  03/29/2024   This document may have been prepared by [facility] Voice Recognition software and as such may include unintentional dictation errors.

## 2024-03-31 LAB — COVID-19, FLU A+B AND RSV
Influenza A, NAA: NOT DETECTED
Influenza B, NAA: NOT DETECTED
RSV, NAA: NOT DETECTED
SARS-CoV-2, NAA: NOT DETECTED

## 2024-04-01 ENCOUNTER — Ambulatory Visit: Payer: Self-pay

## 2024-04-08 ENCOUNTER — Other Ambulatory Visit: Payer: Self-pay | Admitting: Nurse Practitioner

## 2024-04-08 ENCOUNTER — Encounter: Payer: Self-pay | Admitting: Internal Medicine

## 2024-04-08 ENCOUNTER — Ambulatory Visit: Admitting: Nurse Practitioner

## 2024-04-10 ENCOUNTER — Ambulatory Visit: Admitting: Nurse Practitioner

## 2024-04-10 ENCOUNTER — Ambulatory Visit

## 2024-04-10 VITALS — BP 110/76 | HR 83 | Ht 61.0 in | Wt 114.0 lb

## 2024-04-10 DIAGNOSIS — J209 Acute bronchitis, unspecified: Secondary | ICD-10-CM

## 2024-04-10 DIAGNOSIS — J440 Chronic obstructive pulmonary disease with acute lower respiratory infection: Secondary | ICD-10-CM | POA: Diagnosis not present

## 2024-04-10 NOTE — Progress Notes (Signed)
 Established Patient Office Visit  Subjective   Patient ID: Andrea Daugherty, female    DOB: [birth year]  Age: 53 y.o. MRN: [id number]  Chief Complaint  Patient presents with   Care Management    10 day follow up from previous visit     HPI Discussed the use of AI scribe software for clinical note transcription with the patient, who gave verbal consent to proceed.  History of Present Illness   Andrea Daugherty is a 53 year old female who presents with persistent respiratory symptoms and fatigue following a recent illness.  Respiratory symptoms - Persistent wheezing initially identified during previous visit, later became noticeable to her - Improvement in wheezing since last visit - History of recurrent bronchitis, with exacerbations during the COVID pandemic, including an episode of 'double bronchitis' that took several months to resolve - No severe cases during two prior COVID infections - Completed a course of steroids and cough medicine - Continues to use Symbicort , with a recent change in brand after finishing previous prescription - Scheduled to see a pulmonary specialist next week  Fatigue and sleep disturbance - Ongoing fatigue attributed to recent illness and prednisone  use - Completed prednisone  course on previous Thursday - Difficulty sleeping while taking prednisone       Patient Active Problem List   Diagnosis Date Noted   Chest pain 03/29/2024   Allergic rhinitis 03/15/2024   COPD (chronic obstructive pulmonary disease) (HCC) 02/14/2024   Colitis 02/14/2024   Nicotine dependence 02/14/2024   Tennis elbow syndrome 07/11/2013   Shoulder pain 08/17/2011   Rotator cuff syndrome of right shoulder 08/17/2011     ROS    Objective:     BP 110/76   Pulse 83   Ht 5' 1 (1.549 m)   Wt 114 lb (51.7 kg)   LMP  (LMP Unknown)   SpO2 96%   BMI 21.54 kg/m  BP Readings from Last 3 Encounters:  04/10/24 110/76  03/29/24 111/73  03/15/24 100/68   Wt Readings from  Last 3 Encounters:  04/10/24 114 lb (51.7 kg)  03/29/24 118 lb (53.5 kg)  03/15/24 116 lb 9.6 oz (52.9 kg)     Physical Exam Vitals and nursing note reviewed.  Constitutional:      Appearance: Normal appearance.  HENT:     Head: Normocephalic.     Right Ear: Tympanic membrane, ear canal and external ear normal.     Left Ear: Tympanic membrane, ear canal and external ear normal.     Nose: Nose normal.     Mouth/Throat:     Mouth: Mucous membranes are moist.     Pharynx: Oropharynx is clear.  Eyes:     Extraocular Movements: Extraocular movements intact.     Pupils: Pupils are equal, round, and reactive to light.  Cardiovascular:     Rate and Rhythm: Normal rate and regular rhythm.  Pulmonary:     Effort: Pulmonary effort is normal.     Breath sounds: Examination of the right-upper field reveals wheezing. Examination of the right-middle field reveals wheezing and rhonchi. Examination of the left-middle field reveals wheezing and rhonchi. Wheezing and rhonchi present.  Musculoskeletal:     Cervical back: Normal range of motion and neck supple.  Skin:    General: Skin is warm and dry.  Neurological:     Mental Status: She is alert and oriented to person, place, and time.  Psychiatric:        Mood and Affect: Mood normal.  Thought Content: Thought content normal.     No results found for any visits on 04/10/24.    The ASCVD Risk score (Arnett DK, et al., 2019) failed to calculate for the following reasons:   Cannot find a previous HDL lab   Cannot find a previous total cholesterol lab    Assessment & Plan:   Problem List Items Addressed This Visit   None Visit Diagnoses       Acute bronchitis with COPD (HCC)    -  Primary   Chronic cough with wheezing, partially responsive to steroids and Symbicort . Continue Symbicort  inhaler. Refer to pulmonary specialist for further evaluation.        Return for as needed.    Leita Longs, FNP

## 2024-04-14 NOTE — Progress Notes (Addendum)
 Andrea Daugherty, female    DOB: February 07, 1971    MRN: [id number]   Brief patient profile:  53  yowf active smoker with pattern of recurrent rhintis/ bronchitis age 53 better p quit working in [employer]/cl[employer] age 53  referred to pulmonary clinic in [place]  04/17/2024 by [name]  for refractory chronic cough with MSCP since fall 2020 some better with prednisone  the best but relapses w/in a week last dose was around Sept 11 25   Pt not previously seen by [facility] service.     History of Present Illness  04/17/2024  Pulmonary/ 1st office eval/ Jamarie Mussa / [place] [facility]  Chief Complaint  Patient presents with   Consult    Post COVID, recurrent inflammation, wheezing  Dyspnea:  walking around farm x 15 min some hills  Cough: after stirring each am w/in 5 min cough  mostly thin mucoid  x few coughs and also at his x 5 min then settles down  Sleep: bed is flat / 2 pillows  SABA use: none now  02: none  LDSCT: ordered   No obvious day to day or daytime pattern/variability or assoc   purulent sputum or mucus plugs or hemoptysis or cp or chest tightness, or overt hb symptoms.    Also denies any obvious fluctuation of symptoms with weather or environmental changes or other aggravating or alleviating factors except as outlined above   No unusual exposure hx or h/o childhood pna/ asthma or knowledge of premature birth.  Current Allergies, Complete Past Medical History, Past Surgical History, Family History, and Social History were reviewed in [employer] record.  ROS  The following are not active complaints unless bolded Hoarseness, sore throat, dysphagia, dental problems, itching, sneezing,  nasal congestion or discharge of excess mucus or purulent secretions, ear ache,   fever, chills, sweats, unintended wt loss or wt gain, classically pleuritic or exertional cp,  orthopnea pnd or arm/hand swelling  or leg swelling, presyncope, palpitations, abdominal pain, anorexia, nausea,  vomiting, diarrhea  or change in bowel habits or change in bladder habits, change in stools or change in urine, dysuria, hematuria,  arthralgias,   rash, visual complaints, headache, numbness, weakness or ataxia or problems with walking or coordination,  change in mood or  memory.            Outpatient Medications Prior to Visit  Medication Sig Dispense Refill   albuterol  (VENTOLIN  HFA) 108 (90 Base) MCG/ACT inhaler Inhale 2 puffs into the lungs every 4 (four) hours as needed. 18 g 0   budesonide -formoterol  (SYMBICORT ) 80-4.5 MCG/ACT inhaler Inhale 2 puffs into the lungs 2 (two) times daily. Rinse mouth after each use. 1 each 11   loratadine  (CLARITIN ) 10 MG tablet Take 1 tablet (10 mg total) by mouth daily. 30 tablet 11   No facility-administered medications prior to visit.    Past Medical History:  Diagnosis Date   Lymphocytic colitis       Objective:     BP 111/75   Pulse 64   Temp (!) 97.5 F (36.4 C) (Oral)   Ht 5' 1 (1.549 m)   Wt 118 lb (53.5 kg)   LMP  (LMP Unknown)   SpO2 99% Comment: room air  BMI 22.30 kg/m   SpO2: 99 % (room air)  Amb pleasant wf nad     HEENT : Oropharynx  clear      Nasal turbinates nl    NECK :  without  apparent JVD/ palpable  Nodes/TM    LUNGS: no acc muscle use,  Nl contour chest which is clear to A and P bilaterally without cough on insp or exp maneuvers   CV:  RRR  no s3 or murmur or increase in P2, and no edema   ABD:  soft and nontender   MS:  Gait nl   ext warm without deformities Or obvious joint restrictions  calf tenderness, cyanosis or clubbing    SKIN: warm and dry without lesions    NEURO:  alert, approp, nl sensorium with  no motor or cerebellar deficits apparent.       Assessment   Assessment & Plan Asthmatic bronchitis , chronic (HCC) Active smoker with recurrent sinusitis/ bronchitis since 20s / much worse since covid  - 04/17/2024  After extensive coaching inhaler device,  effectiveness =    80% so    >>>  continue symbicort  80 2bid / mucinex dm and short course otc gerd rx and f/u in 6 weeks to eval benefit and plan pfts next   Cigarette smoker 4  min discussion re active cigarette smoking in addition to office E&M  Ask about tobacco use:   ongoing  Advise quitting   I took an extended  opportunity with this patient to outline the consequences of continued cigarette use  in airway disorders based on all the data we have from the multiple national lung health studies (perfomed over decades at millions of dollars in cost)  indicating that smoking cessation, not choice of inhalers or pulmonary physicians, is the most important aspect of her  care and may result in elimination of cough w/in a few weeks   Assess willingness:  Not committed at this point Assist in quit attempt:  Per PCP when ready Arrange follow up:   Follow up per Primary Care planned   Low-dose CT lung cancer screening is recommended for patients who are 52-52 years of age with a 20+ pack-year history of smoking and who are currently smoking or quit <=15 years ago. No coughing up blood  No unintentional weight loss of > 15 pounds in the last 6 months - pt is eligible for scanning yearly until has stopped smoking x 15 y > referred today       Chronic sinusitis, unspecified location Onset age mid 52s c/w Upper airway cough syndrome (previously labeled PNDS),  is so named because it's frequently impossible to sort out how much is  CR/sinusitis with freq throat clearing (which can be related to primary GERD)   vs  causing  secondary ( extra esophageal)  GERD from wide swings in gastric pressure that occur with throat clearing, often  promoting self use of mint and menthol lozenges that reduce the lower esophageal sphincter tone and exacerbate the problem further in a cyclical fashion.   These are the same pts (now being labeled as having irritable larynx syndrome by some cough centers) who not infrequently have a history  of having failed to tolerate ace inhibitors,  dry powder inhalers (or sometimes even higher doses of ICS) or biphosphonates or report having atypical/extraesophageal reflux symptoms from LPR (globus, throat clearing)  that don't respond to standard doses of PPI  and are easily confused as having aecopd or asthma flares by even experienced allergists/ pulmonologists (myself included).  >>>  keep ics dose at the lowest possible level (symb 80 ideally or dulera 100)   >>>  max otc gerd rx reviewed for flares  >>> 04/17/2024 try 1st gen H1 blockers  per guidelines  for noct cough  >>> Sinus CT ordered  04/17/2024 / may need hrct for bronchiectasis also depending on LDSCt results which will need to be reviewed first.        Each maintenance medication was reviewed in detail including emphasizing most importantly the difference between maintenance and prns and under what circumstances the prns are to be triggered using an action plan format where appropriate.  Total time for H and P, chart review, counseling, reviewing hfa  device(s) and generating customized AVS unique to this office visit / same day charting = 50 min new pt eval                AVS  Patient Instructions  We will be calling you to get a Low dose screening CT and sinus CT same day at [facility]  At onset of cough > mucinex DM 1200mg  as needed and add Try prilosec otc 20mg   Take 30-60 min before first meal of the day and Pepcid ac (famotidine) 20 mg one @  bedtime until cough is completely gone for at least a week without the need for cough suppression     GERD (REFLUX)  is an extremely common cause of respiratory symptoms just like yours , many times with no obvious heartburn at all.    It can be treated with medication, but also with lifestyle changes including elevation of the head of your bed (ideally with 6 -8inch blocks under the headboard of your bed),  Smoking cessation, avoidance of late meals, excessive alcohol, and avoid fatty  foods, chocolate, peppermint, colas, red wine, and acidic juices such as orange juice.  NO MINT OR MENTHOL PRODUCTS SO NO COUGH DROPS (luden's with PECTIN ok)  USE SUGARLESS CANDY INSTEAD (Jolley ranchers or Stover's or Life Savers) or even ice chips will also do - the key is to swallow to prevent all throat clearing. NO OIL BASED VITAMINS - use powdered substitutes.  Avoid fish oil when coughing.   For drainage / throat tickle try take CHLORPHENIRAMINE  4 mg    start with just a dose or two an hour before bedtime and see how you tolerate it before trying in daytime.    Please schedule a follow up office visit in 6 weeks, call sooner if needed with all medications /inhalers/ solutions in hand so we can verify exactly what you are taking. This includes all medications from all doctors and over the counters            Ozell America, MD 04/17/2024

## 2024-04-17 ENCOUNTER — Encounter: Payer: Self-pay | Admitting: Internal Medicine

## 2024-04-17 ENCOUNTER — Ambulatory Visit: Admitting: Internal Medicine

## 2024-04-17 VITALS — BP 111/75 | HR 64 | Temp 97.5°F | Ht 61.0 in | Wt 118.0 lb

## 2024-04-17 DIAGNOSIS — J4489 Other specified chronic obstructive pulmonary disease: Secondary | ICD-10-CM

## 2024-04-17 DIAGNOSIS — F17210 Nicotine dependence, cigarettes, uncomplicated: Secondary | ICD-10-CM

## 2024-04-17 DIAGNOSIS — J329 Chronic sinusitis, unspecified: Secondary | ICD-10-CM | POA: Diagnosis not present

## 2024-04-17 MED ORDER — BREZTRI AEROSPHERE 160-9-4.8 MCG/ACT IN AERO
2.0000 | INHALATION_SPRAY | Freq: Two times a day (BID) | RESPIRATORY_TRACT | Status: DC
Start: 2024-04-17 — End: 2024-05-30

## 2024-04-17 NOTE — Patient Instructions (Addendum)
 We will be calling you to get a Low dose screening CT and sinus CT same day at [facility]  At onset of cough > mucinex DM 1200mg  as needed and add Try prilosec otc 20mg   Take 30-60 min before first meal of the day and Pepcid ac (famotidine) 20 mg one @  bedtime until cough is completely gone for at least a week without the need for cough suppression     GERD (REFLUX)  is an extremely common cause of respiratory symptoms just like yours , many times with no obvious heartburn at all.    It can be treated with medication, but also with lifestyle changes including elevation of the head of your bed (ideally with 6 -8inch blocks under the headboard of your bed),  Smoking cessation, avoidance of late meals, excessive alcohol, and avoid fatty foods, chocolate, peppermint, colas, red wine, and acidic juices such as orange juice.  NO MINT OR MENTHOL PRODUCTS SO NO COUGH DROPS (luden's with PECTIN ok)  USE SUGARLESS CANDY INSTEAD (Jolley ranchers or Stover's or Life Savers) or even ice chips will also do - the key is to swallow to prevent all throat clearing. NO OIL BASED VITAMINS - use powdered substitutes.  Avoid fish oil when coughing.   For drainage / throat tickle try take CHLORPHENIRAMINE  4 mg    start with just a dose or two an hour before bedtime and see how you tolerate it before trying in daytime.    Please schedule a follow up office visit in 6 weeks, call sooner if needed with all medications /inhalers/ solutions in hand so we can verify exactly what you are taking. This includes all medications from all doctors and over the counters

## 2024-04-17 NOTE — Assessment & Plan Note (Addendum)
 Active smoker with recurrent sinusitis/ bronchitis since 20s / much worse since covid  - 04/17/2024  After extensive coaching inhaler device,  effectiveness =    80% so   >>>  continue symbicort  80 2bid / mucinex dm and short course otc gerd rx and f/u in 6 weeks to eval benefit and plan pfts next

## 2024-04-17 NOTE — Assessment & Plan Note (Addendum)
 Onset age mid 82s c/w Upper airway cough syndrome (previously labeled PNDS),  is so named because it's frequently impossible to sort out how much is  CR/sinusitis with freq throat clearing (which can be related to primary GERD)   vs  causing  secondary ( extra esophageal)  GERD from wide swings in gastric pressure that occur with throat clearing, often  promoting self use of mint and menthol lozenges that reduce the lower esophageal sphincter tone and exacerbate the problem further in a cyclical fashion.   These are the same pts (now being labeled as having irritable larynx syndrome by some cough centers) who not infrequently have a history of having failed to tolerate ace inhibitors,  dry powder inhalers (or sometimes even higher doses of ICS) or biphosphonates or report having atypical/extraesophageal reflux symptoms from LPR (globus, throat clearing)  that don't respond to standard doses of PPI  and are easily confused as having aecopd or asthma flares by even experienced allergists/ pulmonologists (myself included).  >>>  keep ics dose at the lowest possible level (symb 80 ideally or dulera 100)   >>>  max otc gerd rx reviewed for flares  >>> 04/17/2024 try 1st gen H1 blockers per guidelines  for noct cough  >>> Sinus CT ordered  04/17/2024 / may need hrct for bronchiectasis also depending on LDSCt results which will need to be reviewed first.        Each maintenance medication was reviewed in detail including emphasizing most importantly the difference between maintenance and prns and under what circumstances the prns are to be triggered using an action plan format where appropriate.  Total time for H and P, chart review, counseling, reviewing hfa  device(s) and generating customized AVS unique to this office visit / same day charting = 50 min new pt eval

## 2024-04-17 NOTE — Assessment & Plan Note (Addendum)
 4  min discussion re active cigarette smoking in addition to office E&M  Ask about tobacco use:   ongoing  Advise quitting   I took an extended  opportunity with this patient to outline the consequences of continued cigarette use  in airway disorders based on all the data we have from the multiple national lung health studies (perfomed over decades at millions of dollars in cost)  indicating that smoking cessation, not choice of inhalers or pulmonary physicians, is the most important aspect of her  care and may result in elimination of cough w/in a few weeks   Assess willingness:  Not committed at this point Assist in quit attempt:  Per PCP when ready Arrange follow up:   Follow up per Primary Care planned   Low-dose CT lung cancer screening is recommended for patients who are 55-55 years of age with a 20+ pack-year history of smoking and who are currently smoking or quit <=15 years ago. No coughing up blood  No unintentional weight loss of > 15 pounds in the last 6 months - pt is eligible for scanning yearly until has stopped smoking x 15 y > referred today

## 2024-04-29 ENCOUNTER — Ambulatory Visit (INDEPENDENT_AMBULATORY_CARE_PROVIDER_SITE_OTHER): Admitting: Gastroenterology

## 2024-05-08 ENCOUNTER — Ambulatory Visit: Payer: Self-pay

## 2024-05-08 ENCOUNTER — Encounter: Payer: Self-pay | Admitting: Internal Medicine

## 2024-05-08 ENCOUNTER — Ambulatory Visit: Admitting: Internal Medicine

## 2024-05-08 VITALS — BP 104/67 | HR 85 | Ht 61.0 in | Wt 115.0 lb

## 2024-05-08 DIAGNOSIS — J209 Acute bronchitis, unspecified: Secondary | ICD-10-CM | POA: Diagnosis not present

## 2024-05-08 DIAGNOSIS — J329 Chronic sinusitis, unspecified: Secondary | ICD-10-CM | POA: Diagnosis not present

## 2024-05-08 DIAGNOSIS — K219 Gastro-esophageal reflux disease without esophagitis: Secondary | ICD-10-CM | POA: Diagnosis not present

## 2024-05-08 DIAGNOSIS — J440 Chronic obstructive pulmonary disease with acute lower respiratory infection: Secondary | ICD-10-CM

## 2024-05-08 MED ORDER — METHYLPREDNISOLONE 4 MG PO TBPK: ORAL_TABLET | ORAL | 0 refills | Status: DC

## 2024-05-08 MED ORDER — GUAIFENESIN-CODEINE 100-10 MG/5ML PO SOLN: 5.0000 mL | Freq: Three times a day (TID) | ORAL | 0 refills | Status: DC | PRN

## 2024-05-08 MED ORDER — OMEPRAZOLE 20 MG PO CPDR: 20.0000 mg | DELAYED_RELEASE_CAPSULE | Freq: Every day | ORAL | 5 refills | Status: DC

## 2024-05-08 MED ORDER — ALBUTEROL SULFATE HFA 108 (90 BASE) MCG/ACT IN AERS: 2.0000 | INHALATION_SPRAY | RESPIRATORY_TRACT | 2 refills | Status: AC | PRN

## 2024-05-08 MED ORDER — AMOXICILLIN-POT CLAVULANATE 875-125 MG PO TABS: 1.0000 | ORAL_TABLET | Freq: Two times a day (BID) | ORAL | 0 refills | Status: DC

## 2024-05-08 NOTE — Progress Notes (Signed)
 Acute Office Visit  Subjective:    Patient ID: Andrea Daugherty, female    DOB: 12-20-1970, 53 y.o.   MRN: [id number]  Chief Complaint  Patient presents with   Cough    Cough with congestion for three days, green mucus     HPI Patient is in today for complaint of recent worsening of cough, nasal congestion and mild dyspnea for the last 3 days.  She reports yellowish-green mucoid expectoration with cough.  Denies any fever or chills currently.  She has a history of chronic sinusitis and COPD.  She is using Breztri  as maintenance inhaler, but has run out of albuterol  currently.  She smokes about 6 cigarettes/day, and has been trying to cut down.  She reports chronic epigastric burning sensation.  Denies any nausea or vomiting currently.  Past Medical History:  Diagnosis Date   Lymphocytic colitis     Past Surgical History:  Procedure Laterality Date   CESAREAN SECTION     DILATION AND CURETTAGE OF UTERUS     NOVASURE ABLATION     TUBAL LIGATION     WISDOM TOOTH EXTRACTION      Family History  Problem Relation Age of Onset   Cancer Mother    Heart failure Father     Social History   Socioeconomic History   Marital status: Married    Spouse name: Not on file   Number of children: Not on file   Years of education: 12   Highest education level: Not on file  Occupational History    Employer: [employer]  Tobacco Use   Smoking status: Every Day    Current packs/day: 0.50    Average packs/day: 0.5 packs/day for 28.0 years (14.0 ttl pk-yrs)    Types: Cigarettes   Smokeless tobacco: Never  Vaping Use   Vaping status: Never Used  Substance and Sexual Activity   Alcohol use: No   Drug use: No   Sexual activity: Yes    Birth control/protection: Surgical  Other Topics Concern   Not on file  Social History Narrative   Not on file   Social Drivers of Health   Financial Resource Strain: Not on file  Food Insecurity: Not on file  Transportation Needs: Not  on file  Physical Activity: Not on file  Stress: Not on file  Social Connections: Not on file  Intimate Partner Violence: Not on file    Outpatient Medications Prior to Visit  Medication Sig Dispense Refill   budesonide -formoterol  (SYMBICORT ) 80-4.5 MCG/ACT inhaler Inhale 2 puffs into the lungs 2 (two) times daily. Rinse mouth after each use. 1 each 11   budesonide -glycopyrrolate-formoterol  (BREZTRI  AEROSPHERE) 160-9-4.8 MCG/ACT AERO inhaler Inhale 2 puffs into the lungs in the morning and at bedtime.     loratadine  (CLARITIN ) 10 MG tablet Take 1 tablet (10 mg total) by mouth daily. 30 tablet 11   albuterol  (VENTOLIN  HFA) 108 (90 Base) MCG/ACT inhaler Inhale 2 puffs into the lungs every 4 (four) hours as needed. 18 g 0   No facility-administered medications prior to visit.    Allergies  Allergen Reactions   Wasp Venom    Hydrocodone  Rash    Review of Systems  Constitutional:  Negative for chills and fever.  HENT:  Positive for congestion, postnasal drip, sinus pressure and sore throat.   Eyes:  Negative for pain and discharge.  Respiratory:  Positive for cough and shortness of breath.   Cardiovascular:  Negative for chest pain and palpitations.  Gastrointestinal:  Positive for abdominal pain. Negative for diarrhea, nausea and vomiting.  Endocrine: Negative for polydipsia and polyuria.  Genitourinary:  Negative for dysuria and hematuria.  Musculoskeletal:  Negative for neck pain and neck stiffness.  Skin:  Negative for rash.  Neurological:  Negative for dizziness and weakness.  Psychiatric/Behavioral:  Negative for agitation and behavioral problems.        Objective:    Physical Exam Vitals reviewed.  Constitutional:      General: She is not in acute distress.    Appearance: She is not diaphoretic.  HENT:     Head: Normocephalic and atraumatic.     Nose: Nose normal.     Mouth/Throat:     Mouth: Mucous membranes are moist.  Eyes:     General: No scleral icterus.     Extraocular Movements: Extraocular movements intact.  Cardiovascular:     Rate and Rhythm: Normal rate and regular rhythm.     Heart sounds: Normal heart sounds. No murmur heard. Pulmonary:     Breath sounds: Wheezing (Bilateral upper lung fields) present. No rales.  Musculoskeletal:     Cervical back: Neck supple. No tenderness.     Right lower leg: No edema.     Left lower leg: No edema.  Skin:    General: Skin is warm.     Findings: No rash.  Neurological:     General: No focal deficit present.     Mental Status: She is alert and oriented to person, place, and time.  Psychiatric:        Mood and Affect: Mood normal.        Behavior: Behavior normal.     BP 104/67   Pulse 85   Ht 5' 1 (1.549 m)   Wt 115 lb (52.2 kg)   LMP  (LMP Unknown)   SpO2 98%   BMI 21.73 kg/m  Wt Readings from Last 3 Encounters:  05/08/24 115 lb (52.2 kg)  04/17/24 118 lb (53.5 kg)  04/10/24 114 lb (51.7 kg)        Assessment & Plan:   Problem List Items Addressed This Visit       Respiratory   Chronic sinusitis - Primary   Uncontrolled, has tried OTC antihistaminic without much relief Can try Flonase for nasal congestion/allergies Planned to get CT of maxillofacial sinuses in 11/25      Relevant Medications   methylPREDNISolone  (MEDROL  DOSEPAK) 4 MG TBPK tablet   amoxicillin -clavulanate (AUGMENTIN ) 875-125 MG tablet   guaiFENesin-codeine 100-10 MG/5ML syrup   Acute bronchitis with COPD (HCC)   Recent worsening of cough and dyspnea likely due to acute bronchitis Started empiric Augmentin , considering possible acute sinusitis Medrol  Dosepak prescribed Continue Breztri  as maintenance inhaler Refilled albuterol  inhaler for dyspnea or wheezing Cheratussin as needed for cough Followed by pulmonology for COPD       Relevant Medications   methylPREDNISolone  (MEDROL  DOSEPAK) 4 MG TBPK tablet   amoxicillin -clavulanate (AUGMENTIN ) 875-125 MG tablet   albuterol  (VENTOLIN  HFA) 108 (90  Base) MCG/ACT inhaler   guaiFENesin-codeine 100-10 MG/5ML syrup     Digestive   Gastroesophageal reflux disease   Her epigastric burning is likely due to GERD Prescribed omeprazole 20 mg QD Can continue Pepcid 20 mg QPM as needed Avoid hot and spicy food, needs to cut down -> quit smoking      Relevant Medications   omeprazole (PRILOSEC) 20 MG capsule   Addendum: Omeprazole not covered/preferred by insurance.  Switched to pantoprazole.  Meds ordered this encounter  Medications   methylPREDNISolone  (MEDROL  DOSEPAK) 4 MG TBPK tablet    Sig: Take as package instructions.    Dispense:  1 each    Refill:  0   amoxicillin -clavulanate (AUGMENTIN ) 875-125 MG tablet    Sig: Take 1 tablet by mouth 2 (two) times daily.    Dispense:  14 tablet    Refill:  0   albuterol  (VENTOLIN  HFA) 108 (90 Base) MCG/ACT inhaler    Sig: Inhale 2 puffs into the lungs every 4 (four) hours as needed.    Dispense:  18 g    Refill:  2   guaiFENesin-codeine 100-10 MG/5ML syrup    Sig: Take 5 mLs by mouth 3 (three) times daily as needed for cough.    Dispense:  120 mL    Refill:  0   omeprazole (PRILOSEC) 20 MG capsule    Sig: Take 1 capsule (20 mg total) by mouth daily.    Dispense:  30 capsule    Refill:  5     Loyce Klasen MARLA Blanch, MD

## 2024-05-08 NOTE — Telephone Encounter (Signed)
 Appt made.

## 2024-05-08 NOTE — Patient Instructions (Addendum)
 Please start taking Augmentin  and Prednisone  as prescribed.  Please take cough syrup as needed for cough.  Please continue taking Omeprazole as prescribed for acid reflux.

## 2024-05-08 NOTE — Assessment & Plan Note (Signed)
 Recent worsening of cough and dyspnea likely due to acute bronchitis Started empiric Augmentin , considering possible acute sinusitis Medrol  Dosepak prescribed Continue Breztri  as maintenance inhaler Refilled albuterol  inhaler for dyspnea or wheezing Cheratussin as needed for cough Followed by pulmonology for COPD

## 2024-05-08 NOTE — Assessment & Plan Note (Addendum)
 Uncontrolled, has tried OTC antihistaminic without much relief Can try Flonase for nasal congestion/allergies Planned to get CT of maxillofacial sinuses in 11/25

## 2024-05-08 NOTE — Telephone Encounter (Signed)
 FYI Only or Action Required?: FYI only for provider.  Patient was last seen in primary care on 04/10/2024 by Bevely Doffing, FNP.  Called Nurse Triage reporting Cough.  Symptoms began several weeks ago.  Interventions attempted: Rest, hydration, or home remedies.  Symptoms are: unchanged.  Triage Disposition: See Physician Within 24 Hours  Patient/caregiver understands and will follow disposition?: Yes    Copied from CRM #[id number]. Topic: Clinical - Red Word Triage >> May 08, 2024 11:15 AM Gustabo D wrote: Pt is coughing and wheezing for about 3 days says it's getting worse Reason for Disposition  [1] Known COPD or other severe lung disease (i.e., bronchiectasis, cystic fibrosis, lung surgery) AND [2] symptoms getting worse (i.e., increased sputum purulence or amount, increased breathing difficulty  Answer Assessment - Initial Assessment Questions Additional info: Needs to schedule TOC.     1. ONSET: When did the cough begin?      Two-three weeks 2. SEVERITY: How bad is the cough today?      Moderate  3. SPUTUM: Describe the color of your sputum (e.g., none, dry cough; clear, white, yellow, green)     green 4. HEMOPTYSIS: Are you coughing up any blood? If Yes, ask: How much? (e.g., flecks, streaks, tablespoons, etc.)      5. DIFFICULTY BREATHING: Are you having difficulty breathing? If Yes, ask: How bad is it? (e.g., mild, moderate, severe)      Intermittent wheezing  6. FEVER: Do you have a fever? If Yes, ask: What is your temperature, how was it measured, and when did it start?     denies 7. CARDIAC HISTORY: Do you have any history of heart disease? (e.g., heart attack, congestive heart failure)       8. LUNG HISTORY: Do you have any history of lung disease?  (e.g., pulmonary embolus, asthma, emphysema)     copd 9. PE RISK FACTORS: Do you have a history of blood clots? (or: recent major surgery, recent prolonged travel, bedridden)      10. OTHER  SYMPTOMS: Do you have any other symptoms? (e.g., runny nose, wheezing, chest pain)       Intermittent wheezing controlled with inhalers 11. PREGNANCY: Is there any chance you are pregnant? When was your last menstrual period?        12. TRAVEL: Have you traveled out of the country in the last month? (e.g., travel history, exposures)  Protocols used: Cough - Acute Productive-A-AH

## 2024-05-08 NOTE — Assessment & Plan Note (Addendum)
 Her epigastric burning is likely due to GERD Prescribed omeprazole 20 mg QD Can continue Pepcid 20 mg QPM as needed Avoid hot and spicy food, needs to cut down -> quit smoking

## 2024-05-09 MED ORDER — PANTOPRAZOLE SODIUM 20 MG PO TBEC: 20.0000 mg | DELAYED_RELEASE_TABLET | Freq: Every day | ORAL | 3 refills | Status: DC

## 2024-05-09 NOTE — Addendum Note (Signed)
 Addended byBETHA TOBIE DOWNS on: 05/09/2024 04:55 PM   Modules accepted: Orders

## 2024-05-13 ENCOUNTER — Other Ambulatory Visit: Payer: Self-pay | Admitting: Internal Medicine

## 2024-05-13 DIAGNOSIS — K219 Gastro-esophageal reflux disease without esophagitis: Secondary | ICD-10-CM

## 2024-05-13 MED ORDER — OMEPRAZOLE 40 MG PO CPDR: 40.0000 mg | DELAYED_RELEASE_CAPSULE | Freq: Every day | ORAL | 3 refills | Status: AC

## 2024-05-29 ENCOUNTER — Ambulatory Visit (HOSPITAL_COMMUNITY)
Admission: RE | Admit: 2024-05-29 | Discharge: 2024-05-29 | Disposition: A | Discharge status: Home / Self Care | Source: Ambulatory Visit | Attending: Internal Medicine | Admitting: Internal Medicine

## 2024-05-29 DIAGNOSIS — J342 Deviated nasal septum: Secondary | ICD-10-CM | POA: Diagnosis not present

## 2024-05-29 DIAGNOSIS — J329 Chronic sinusitis, unspecified: Secondary | ICD-10-CM | POA: Insufficient documentation

## 2024-05-29 DIAGNOSIS — F17210 Nicotine dependence, cigarettes, uncomplicated: Secondary | ICD-10-CM | POA: Diagnosis not present

## 2024-05-29 DIAGNOSIS — R059 Cough, unspecified: Secondary | ICD-10-CM | POA: Diagnosis not present

## 2024-05-30 ENCOUNTER — Encounter: Payer: Self-pay | Admitting: Internal Medicine

## 2024-05-30 ENCOUNTER — Ambulatory Visit (INDEPENDENT_AMBULATORY_CARE_PROVIDER_SITE_OTHER): Admitting: Internal Medicine

## 2024-05-30 VITALS — BP 111/75 | HR 84 | Ht 61.0 in | Wt 112.0 lb

## 2024-05-30 DIAGNOSIS — F17210 Nicotine dependence, cigarettes, uncomplicated: Secondary | ICD-10-CM | POA: Diagnosis not present

## 2024-05-30 DIAGNOSIS — J4489 Other specified chronic obstructive pulmonary disease: Secondary | ICD-10-CM

## 2024-05-30 DIAGNOSIS — J329 Chronic sinusitis, unspecified: Secondary | ICD-10-CM

## 2024-05-30 MED ORDER — PREDNISONE 10 MG PO TABS: ORAL_TABLET | ORAL | 0 refills | Status: DC

## 2024-05-30 MED ORDER — BREZTRI AEROSPHERE 160-9-4.8 MCG/ACT IN AERO: 2.0000 | INHALATION_SPRAY | Freq: Two times a day (BID) | RESPIRATORY_TRACT | Status: AC

## 2024-05-30 MED ORDER — FAMOTIDINE 20 MG PO TABS: ORAL_TABLET | ORAL | 11 refills | Status: AC

## 2024-05-30 MED ORDER — PANTOPRAZOLE SODIUM 40 MG PO TBEC: 40.0000 mg | DELAYED_RELEASE_TABLET | Freq: Every day | ORAL | 2 refills | Status: AC

## 2024-05-30 NOTE — Patient Instructions (Addendum)
 Symbicort  (breyna )  80 Take 2 puffs first thing in am and then another 2 puffs about 12 hours later.   Ok to use sample Breztri  one twice daily   Work on inhaler technique:  relax and gently blow all the way out then take a nice smooth full deep breath back in, triggering the inhaler at same time you start breathing in.  Hold breath in for at least  5 seconds if you can. Blow out Breyna /symbicort / breztri   thru nose. Rinse and gargle with water when done.  If mouth or throat bother you at all,  try brushing teeth/gums/tongue with arm and hammer toothpaste/ make a slurry and gargle and spit out.    Prednisone  10 mg take  4 each am x 2 days,   2 each am x 2 days,  1 each am x 2 days and stop   Pantoprazole (protonix) 40 mg   Take  30-60 min before first meal of the day and Pepcid (famotidine)  20 mg after supper until return to office - this is the best way to tell whether stomach acid is contributing to your problem.    For cough > mucinex dm 1200 mg every 12 hours as needed   For drainage / throat tickle try take CHLORPHENIRAMINE  4 mg   may cause drowsiness so start with just a dose or two an hour before bedtime and see how you tolerate it before trying in daytime.   Please schedule a follow up office visit in 6 weeks, call sooner if needed with PFTs

## 2024-05-30 NOTE — Progress Notes (Addendum)
 Andrea Daugherty, female    DOB: 1970/08/30    MRN: [id number]   Brief patient profile:  53  yowf active smoker with pattern of recurrent rhintis/ bronchitis age 53 better p quit working in designer, fashion/clothing age 53  referred to pulmonary clinic in [place]  04/17/2024 by Andrea Daugherty  for refractory chronic cough with MSCP since fall 2020 some better with prednisone  the best but relapses w/in a week last dose was around Sept 11 25 and this time prednisone  did not correct the problem  Pt not previously seen by PCCM service.     History of Present Illness  04/17/2024  Pulmonary/ 1st office eval/ Andrea Daugherty / [place] [facility]  Chief Complaint  Patient presents with   Consult    Post COVID, recurrent inflammation, wheezing  Dyspnea:  walking around [place] x 15 min some [place]  Cough: after stirring each am w/in 5 min cough  mostly thin mucoid  x few coughs and also at his x 5 min then settles down  Sleep: bed is flat / 2 pillows  SABA use: none now  02: none  LDSCT: ordered  Patient Instructions  At onset of cough > mucinex DM 1200mg  as needed and add Try prilosec otc 20mg   Take 30-60 min before first meal of the day and Pepcid ac (famotidine) 20 mg one @  bedtime until cough is completely gone for at least a week without the need for cough suppression GERD diet reviewed, bed blocks rec  For drainage / throat tickle try take CHLORPHENIRAMINE  4 mg    start with just a dose or two an hour before bedtime and see how you tolerate it before trying in daytime.  Please schedule a follow up office visit in 6 weeks, call sooner if needed with all medications /inhalers/ solutions in hand        Sinus and LDSCT 05/29/24 pening   05/30/2024  f/u ov/Andrea Daugherty office/Andrea Daugherty re: AB maint  on ppi daily no did not  bring meds Chief Complaint  Patient presents with   Shortness of Breath    Lcs 11/5 Wheezing   Dyspnea:  walking around farm a lot easier on either symbicort  or breztri   Cough: when supine / mostly dry /  dust will also trigger Sleeping: when lie down and again at 2 am    / flat bed with 3 pillows coughs on eiter side or back  SABA use: every 4-6 h  not needed while on symbicort / does help 3 am    No obvious day to day or daytime variability or assoc excess/ purulent sputum or mucus plugs or hemoptysis or cp or chest tightness, subjective wheeze or overt sinus or hb symptoms.    Also denies any obvious fluctuation of symptoms with weather or environmental changes or other aggravating or alleviating factors except as outlined above   No unusual exposure hx or h/o childhood pna/ asthma or knowledge of premature birth.  Current Allergies, Complete Past Medical History, Past Surgical History, Family History, and Social History were reviewed in [employer] record.  ROS  The following are not active complaints unless bolded Hoarseness, sore throat, dysphagia, dental problems, itching, sneezing,  nasal congestion or discharge of excess mucus or purulent secretions, ear ache,   fever, chills, sweats, unintended wt loss or wt gain, classically pleuritic or exertional cp,  orthopnea pnd or arm/hand swelling  or leg swelling, presyncope, palpitations, abdominal pain, anorexia, nausea, vomiting, diarrhea  or change in bowel  habits or change in bladder habits, change in stools or change in urine, dysuria, hematuria,  rash, arthralgias, visual complaints, headache, numbness, weakness or ataxia or problems with walking or coordination,  change in mood or  memory.        Current Meds  Medication Sig   albuterol  (VENTOLIN  HFA) 108 (90 Base) MCG/ACT inhaler Inhale 2 puffs into the lungs every 4 (four) hours as needed.   omeprazole (PRILOSEC) 40 MG capsule Take 1 capsule (40 mg total) by mouth daily.           Past Medical History:  Diagnosis Date   Lymphocytic colitis       Objective:      Wt Readings from Last 3 Encounters:  05/30/24 112 lb (50.8 kg)  05/08/24 115 lb (52.2  kg)  04/17/24 118 lb (53.5 kg)      Vital signs reviewed  05/30/2024  - Note at rest 02 sats  93% on RA   General appearance:    somber wf / cough on insp   toward end but not while breathing in breztri  sample   HEENT : Oropharynx  clear      Nasal turbinates nl    NECK :  without  apparent JVD/ palpable Nodes/TM  very prominent pseudowheeze    LUNGS: no acc muscle use,  Nl contour chest which is clear to A and P bilaterally without cough on insp or exp maneuvers   CV:  RRR  no s3 or murmur or increase in P2, and no edema   ABD:  soft and nontender   MS:  Gait nl   ext warm without deformities Or obvious joint restrictions  calf tenderness, cyanosis or clubbing    SKIN: warm and dry without lesions    NEURO:  alert, approp, nl sensorium with  no motor or cerebellar deficits apparent.       Assessment     Assessment & Plan Asthmatic bronchitis , chronic (HCC) Active smoker with recurrent sinusitis/ bronchitis since 20s / much worse since covid  - 04/17/2024  After extensive coaching inhaler device,  effectiveness =    80% so continue symbicort  80 2bid / mucinex dm and short course otc gerd rx and f/u in 6 weeks  - 05/30/2024 better on symbiocrt worse off it with PW on exam so rec >>>>  change rx  to protonix/ pepcid/ pred x6 d and return in 6 weeks with pft on symbicort  80 maint rx   Chronic sinusitis, unspecified location Onset age 53  - 04/17/2024 try 1st gen H1 blockers per guidelines  for noct cough  - Sinus CT 11/5//25 nl  > no further w/u planned   Cigarette smoker Counseled re importance of smoking cessation but did not meet time criteria for separate billing    >>> check LDSCT from 05/29/24 when available > RAD2  >>> referred to [facility] program for recalls         Each maintenance medication was reviewed in detail including emphasizing most importantly the difference between maintenance and prns and under what circumstances the prns are to be triggered using  an action plan format where appropriate.  Total time for H and P, chart review, counseling, reviewing hfa device(s) and generating customized AVS unique to this office visit / same day charting = 35 min  for chronic  refractory respiratory  symptoms of uncertain etiology.          AVS  Patient Instructions  Symbicort  (breyna )  80 Take 2 puffs first thing in am and then another 2 puffs about 12 hours later.   Ok to use sample Breztri  one twice daily   Work on inhaler technique:  relax and gently blow all the way out then take a nice smooth full deep breath back in, triggering the inhaler at same time you start breathing in.  Hold breath in for at least  5 seconds if you can. Blow out Breyna /symbicort / breztri   thru nose. Rinse and gargle with water when done.  If mouth or throat bother you at all,  try brushing teeth/gums/tongue with arm and hammer toothpaste/ make a slurry and gargle and spit out.    Prednisone  10 mg take  4 each am x 2 days,   2 each am x 2 days,  1 each am x 2 days and stop   Pantoprazole (protonix) 40 mg   Take  30-60 min before first meal of the day and Pepcid (famotidine)  20 mg after supper until return to office - this is the best way to tell whether stomach acid is contributing to your problem.    For cough > mucinex dm 1200 mg every 12 hours as needed   For drainage / throat tickle try take CHLORPHENIRAMINE  4 mg   may cause drowsiness so start with just a dose or two an hour before bedtime and see how you tolerate it before trying in daytime.   Please schedule a follow up office visit in 6 weeks, call sooner if needed with PFTs            Ozell America, MD 05/31/2024    Outpatient Encounter Medications as of 05/30/2024  Medication Sig   albuterol  (VENTOLIN  HFA) 108 (90 Base) MCG/ACT inhaler Inhale 2 puffs into the lungs every 4 (four) hours as needed.   budesonide -glycopyrrolate-formoterol  (BREZTRI  AEROSPHERE) 160-9-4.8 MCG/ACT AERO inhaler Inhale 2 puffs  into the lungs in the morning and at bedtime for 1 day.   famotidine (PEPCID) 20 MG tablet One after supper   omeprazole (PRILOSEC) 40 MG capsule Take 1 capsule (40 mg total) by mouth daily.   pantoprazole (PROTONIX) 40 MG tablet Take 1 tablet (40 mg total) by mouth daily. Take 30-60 min before first meal of the day   predniSONE  (DELTASONE ) 10 MG tablet Take  4 each am x 2 days,   2 each am x 2 days,  1 each am x 2 days and stop   budesonide -formoterol  (SYMBICORT ) 80-4.5 MCG/ACT inhaler Inhale 2 puffs into the lungs 2 (two) times daily. Rinse mouth after each use. (Patient not taking: Reported on 05/30/2024)   [DISCONTINUED] amoxicillin -clavulanate (AUGMENTIN ) 875-125 MG tablet Take 1 tablet by mouth 2 (two) times daily. (Patient not taking: Reported on 05/30/2024)   [DISCONTINUED] budesonide -glycopyrrolate-formoterol  (BREZTRI  AEROSPHERE) 160-9-4.8 MCG/ACT AERO inhaler Inhale 2 puffs into the lungs in the morning and at bedtime. (Patient not taking: Reported on 05/30/2024)   [DISCONTINUED] guaiFENesin-codeine 100-10 MG/5ML syrup Take 5 mLs by mouth 3 (three) times daily as needed for cough. (Patient not taking: Reported on 05/30/2024)   [DISCONTINUED] loratadine  (CLARITIN ) 10 MG tablet Take 1 tablet (10 mg total) by mouth daily. (Patient not taking: Reported on 05/30/2024)   [DISCONTINUED] methylPREDNISolone  (MEDROL  DOSEPAK) 4 MG TBPK tablet Take as package instructions. (Patient not taking: Reported on 05/30/2024)   No facility-administered encounter medications on file as of 05/30/2024.

## 2024-05-31 NOTE — Assessment & Plan Note (Addendum)
 Active smoker with recurrent sinusitis/ bronchitis since 20s / much worse since covid  - 04/17/2024  After extensive coaching inhaler device,  effectiveness =    80% so continue symbicort  80 2bid / mucinex dm and short course otc gerd rx and f/u in 6 weeks  - 05/30/2024 better on symbiocrt worse off it with PW on exam so rec >>>>  change rx  to protonix/ pepcid/ pred x6 d and return in 6 weeks with pft on symbicort  80 maint rx

## 2024-05-31 NOTE — Assessment & Plan Note (Addendum)
 Onset age mid 17s  - 04/17/2024 try 1st gen H1 blockers per guidelines  for noct cough  - Sinus CT 11/5//25 nl  > no further w/u planned

## 2024-05-31 NOTE — Assessment & Plan Note (Addendum)
 Counseled re importance of smoking cessation but did not meet time criteria for separate billing    >>> check LDSCT from 05/29/24 when available > RAD2  >>> referred to [facility] program for recalls         Each maintenance medication was reviewed in detail including emphasizing most importantly the difference between maintenance and prns and under what circumstances the prns are to be triggered using an action plan format where appropriate.  Total time for H and P, chart review, counseling, reviewing hfa device(s) and generating customized AVS unique to this office visit / same day charting = 35 min  for chronic  refractory respiratory  symptoms of uncertain etiology.

## 2024-06-03 ENCOUNTER — Ambulatory Visit: Payer: Self-pay | Admitting: Internal Medicine

## 2024-06-03 NOTE — Progress Notes (Signed)
 Called and spoke with pt and relayed results, pt confirmed understanding

## 2024-06-05 NOTE — Progress Notes (Signed)
 Atc x1 vm not accepting messages

## 2024-06-06 ENCOUNTER — Telehealth: Payer: Self-pay | Admitting: Internal Medicine

## 2024-06-06 NOTE — Telephone Encounter (Signed)
 Dr Darlean, is this appropriate?

## 2024-06-06 NOTE — Telephone Encounter (Signed)
 Copied from CRM #[id number]. Topic: Appointments - Scheduling Inquiry for Clinic >> Jun 06, 2024  2:37 PM Joesph PARAS wrote: Reason for CRM: Patient is inquiring about having provider do a work physical. States she has been turned down by several other providers including PCP and only needs a paper signed to clear her. States has a deadline of December 15th.

## 2024-06-07 ENCOUNTER — Other Ambulatory Visit: Payer: Self-pay

## 2024-06-07 DIAGNOSIS — F17210 Nicotine dependence, cigarettes, uncomplicated: Secondary | ICD-10-CM

## 2024-06-07 NOTE — Telephone Encounter (Signed)
 ATC X1. Unable to lvm as mb is full.

## 2024-06-07 NOTE — Progress Notes (Signed)
 Called and relayed results to pt and pt confirmed understanding - referred for yearly LCS

## 2024-06-10 NOTE — Telephone Encounter (Signed)
 ATC X2. Unable to lvm as mb is full. I will send pt a mychart message then completing note per protocol.

## 2024-06-13 ENCOUNTER — Ambulatory Visit: Admitting: Cardiology

## 2024-06-13 DIAGNOSIS — Z0000 Encounter for general adult medical examination without abnormal findings: Secondary | ICD-10-CM | POA: Diagnosis not present

## 2024-06-13 DIAGNOSIS — Z6822 Body mass index (BMI) 22.0-22.9, adult: Secondary | ICD-10-CM | POA: Diagnosis not present

## 2024-06-13 DIAGNOSIS — F17210 Nicotine dependence, cigarettes, uncomplicated: Secondary | ICD-10-CM | POA: Diagnosis not present

## 2024-06-13 NOTE — Telephone Encounter (Signed)
 Copied from CRM [id number]. Topic: Clinical - Lab/Test Results >> Jun 05, 2024  9:38 AM Isabell A wrote: Reason for CRM: Patient returning phone call from [facility] for results.   Callback number: [phone] requesting a call back after 4:05pm  See phone note

## 2024-06-19 ENCOUNTER — Ambulatory Visit: Attending: Cardiology | Admitting: Cardiology

## 2024-06-19 VITALS — BP 120/80 | HR 80 | Ht 61.0 in | Wt 118.0 lb

## 2024-06-19 DIAGNOSIS — R0602 Shortness of breath: Secondary | ICD-10-CM

## 2024-06-19 NOTE — Patient Instructions (Signed)
 Medication Instructions:  Your physician recommends that you continue on your current medications as directed. Please refer to the Current Medication list given to you today.  *If you need a refill on your cardiac medications before your next appointment, please call your pharmacy*  Lab Work: None If you have labs (blood work) drawn today and your tests are completely normal, you will receive your results only by: MyChart Message (if you have MyChart) OR A paper copy in the mail If you have any lab test that is abnormal or we need to change your treatment, we will call you to review the results.  Testing/Procedures: None  Follow-Up: At [facility], you and your health needs are our priority.  As part of our continuing mission to provide you with exceptional heart care, our providers are all part of one team.  This team includes your primary Cardiologist (physician) and Advanced Practice Providers or APPs (Physician Assistants and Nurse Practitioners) who all work together to provide you with the care you need, when you need it.  Your next appointment:    Follow up as needed.   Provider:   You may see Dorn Ross, MD or the following Advanced Practice Provider on your designated Care Team:   Almarie Crate, NP    We recommend signing up for the patient portal called MyChart.  Sign up information is provided on this After Visit Summary.  MyChart is used to connect with patients for Virtual Visits (Telemedicine).  Patients are able to view lab/test results, encounter notes, upcoming appointments, etc.  Non-urgent messages can be sent to your provider as well.   To learn more about what you can do with MyChart, go to [email or web address].   Other Instructions

## 2024-06-19 NOTE — Progress Notes (Signed)
      Clinical Summary Andrea Daugherty is a 53 y.o.female seen today as a new consult, referred by NP [facility] for the following medical problems.  1.SOB - SOB improving with inhalers. Recent COPD exacerbation, completed predisone - had chest wall pain. Worst moving, tender to touch, cough, moving. This has resolved.  - breathing is about 85% better with recent lung treatments - no recent edema.     2.COPD - followed by pulmonary - smoking history x 36 years.  - PFTs pending    Past Medical History:  Diagnosis Date   Lymphocytic colitis      Allergies  Allergen Reactions   Wasp Venom    Hydrocodone  Rash     Current Outpatient Medications  Medication Sig Dispense Refill   albuterol  (VENTOLIN  HFA) 108 (90 Base) MCG/ACT inhaler Inhale 2 puffs into the lungs every 4 (four) hours as needed. 18 g 2   budesonide -formoterol  (SYMBICORT ) 80-4.5 MCG/ACT inhaler Inhale 2 puffs into the lungs 2 (two) times daily. Rinse mouth after each use. (Patient not taking: Reported on 05/30/2024) 1 each 11   famotidine  (PEPCID ) 20 MG tablet One after supper 30 tablet 11   omeprazole  (PRILOSEC) 40 MG capsule Take 1 capsule (40 mg total) by mouth daily. 30 capsule 3   pantoprazole  (PROTONIX ) 40 MG tablet Take 1 tablet (40 mg total) by mouth daily. Take 30-60 min before first meal of the day 30 tablet 2   predniSONE  (DELTASONE ) 10 MG tablet Take  4 each am x 2 days,   2 each am x 2 days,  1 each am x 2 days and stop 14 tablet 0   No current facility-administered medications for this visit.     Past Surgical History:  Procedure Laterality Date   CESAREAN SECTION     DILATION AND CURETTAGE OF UTERUS     NOVASURE ABLATION     TUBAL LIGATION     WISDOM TOOTH EXTRACTION       Allergies  Allergen Reactions   Wasp Venom    Hydrocodone  Rash      Family History  Problem Relation Age of Onset   Cancer Mother    Heart failure Father      Social History Andrea Daugherty reports that she has been  smoking cigarettes. She has a 14 pack-year smoking history. She has never used smokeless tobacco. Andrea Daugherty reports no history of alcohol use.    Physical Examination Today's Vitals   06/19/24 1304  BP: 120/80  Pulse: 80  SpO2: 98%  Weight: 118 lb (53.5 kg)  Height: 5' 1 (1.549 m)   Body mass index is 22.3 kg/m.  Gen: resting comfortably, no acute distress HEENT: no scleral icterus, pupils equal round and reactive, no palptable cervical adenopathy,  CV: RRR, no mrg, no jvd Resp: Clear to auscultation bilaterally GI: abdomen is soft, non-tender, non-distended, normal bowel sounds, no hepatosplenomegaly MSK: extremities are warm, no edema.  Skin: warm, no rash Neuro:  no focal deficits Psych: appropriate affect     Assessment and Plan  1.SOB - referred to pulm and cards for SOB - seen by pulm, breathing is 85% better on inhalers. Has PFTs pending - prior chest pains were noncardiac. Positional, tender to palpation, worst with coughing and deep breathing - no indication for cardiac testing at this time given improving symptoms with clear pulmonary diagnosis and noncardiac chest pains  Can f/u as needed        Dorn PHEBE Ross, M.D.

## 2024-06-26 ENCOUNTER — Encounter: Payer: Self-pay | Admitting: Emergency Medicine

## 2024-06-26 ENCOUNTER — Ambulatory Visit
Admission: EM | Admit: 2024-06-26 | Discharge: 2024-06-26 | Disposition: A | Discharge status: Home / Self Care | Attending: Family Medicine | Admitting: Family Medicine

## 2024-06-26 DIAGNOSIS — J4541 Moderate persistent asthma with (acute) exacerbation: Secondary | ICD-10-CM | POA: Diagnosis not present

## 2024-06-26 DIAGNOSIS — J069 Acute upper respiratory infection, unspecified: Secondary | ICD-10-CM

## 2024-06-26 MED ORDER — AZELASTINE HCL 0.1 % NA SOLN: 1.0000 | Freq: Two times a day (BID) | NASAL | 0 refills | Status: AC

## 2024-06-26 MED ORDER — PREDNISONE 20 MG PO TABS: 40.0000 mg | ORAL_TABLET | Freq: Every day | ORAL | 0 refills | Status: DC

## 2024-06-26 MED ORDER — PROMETHAZINE-DM 6.25-15 MG/5ML PO SYRP: 5.0000 mL | ORAL_SOLUTION | Freq: Four times a day (QID) | ORAL | 0 refills | Status: DC | PRN

## 2024-06-26 NOTE — ED Triage Notes (Signed)
 Sore throat, nasal drainage productive cough x 3 to 4 days.  States is having facial pain.

## 2024-06-27 NOTE — ED Provider Notes (Signed)
 [facility]    CSN: [id number] Arrival date & time: 06/26/24  1648      History   Chief Complaint No chief complaint on file.   HPI Andrea Daugherty is a 53 y.o. female.   Patient presenting today with 4-day history of sore throat, nasal congestion, hacking cough, chest tightness.  Denies fever, chills, chest pain, shortness of breath, abdominal pain, vomiting, diarrhea.  So far trying over-the-counter remedies with minimal relief.  Compliant with her Symbicort  and albuterol  as needed for COPD.    Past Medical History:  Diagnosis Date   Lymphocytic colitis     Patient Active Problem List   Diagnosis Date Noted   Acute bronchitis with COPD (HCC) 05/08/2024   Gastroesophageal reflux disease 05/08/2024   Asthmatic bronchitis , chronic (HCC) 04/17/2024   Cigarette smoker 04/17/2024   Chronic sinusitis 04/17/2024   Chest pain 03/29/2024   Allergic rhinitis 03/15/2024   COPD (chronic obstructive pulmonary disease) (HCC) 02/14/2024   Colitis 02/14/2024   Nicotine dependence 02/14/2024   Tennis elbow syndrome 07/11/2013   Shoulder pain 08/17/2011   Rotator cuff syndrome of right shoulder 08/17/2011    Past Surgical History:  Procedure Laterality Date   CESAREAN SECTION     DILATION AND CURETTAGE OF UTERUS     NOVASURE ABLATION     TUBAL LIGATION     WISDOM TOOTH EXTRACTION      OB History     Gravida  4   Para  3   Term  3   Preterm      AB  1   Living  3      SAB  1   IAB      Ectopic      Multiple      Live Births               Home Medications    Prior to Admission medications   Medication Sig Start Date End Date Taking? Authorizing Provider  azelastine  (ASTELIN ) 0.1 % nasal spray Place 1 spray into both nostrils 2 (two) times daily. Use in each nostril as directed 06/26/24  Yes Stuart Vernell Norris, PA-C  predniSONE  (DELTASONE ) 20 MG tablet Take 2 tablets (40 mg total) by mouth daily with breakfast. 06/26/24  Yes Stuart Vernell Norris, PA-C  promethazine -dextromethorphan (PROMETHAZINE -DM) 6.25-15 MG/5ML syrup Take 5 mLs by mouth 4 (four) times daily as needed. 06/26/24  Yes Stuart Vernell Norris, PA-C  albuterol  (VENTOLIN  HFA) 108 (90 Base) MCG/ACT inhaler Inhale 2 puffs into the lungs every 4 (four) hours as needed. 05/08/24   Tobie Suzzane POUR, MD  budesonide -formoterol  (SYMBICORT ) 80-4.5 MCG/ACT inhaler Inhale 2 puffs into the lungs 2 (two) times daily. Rinse mouth after each use. 02/14/24   Boswell, Chelsa, NP  famotidine  (PEPCID ) 20 MG tablet One after supper 05/30/24   Darlean Ozell NOVAK, MD  omeprazole  (PRILOSEC) 40 MG capsule Take 1 capsule (40 mg total) by mouth daily. Patient not taking: Reported on 06/19/2024 05/13/24   Tobie Suzzane POUR, MD  pantoprazole  (PROTONIX ) 40 MG tablet Take 1 tablet (40 mg total) by mouth daily. Take 30-60 min before first meal of the day Patient not taking: Reported on 06/19/2024 05/30/24   Darlean Ozell NOVAK, MD    Family History Family History  Problem Relation Age of Onset   Cancer Mother    Heart failure Father     Social History Social History   Tobacco Use   Smoking status: Every Day  Current packs/day: 0.50    Average packs/day: 0.5 packs/day for 28.0 years (14.0 ttl pk-yrs)    Types: Cigarettes   Smokeless tobacco: Never  Vaping Use   Vaping status: Never Used  Substance Use Topics   Alcohol use: No   Drug use: No     Allergies   Wasp venom and Hydrocodone    Review of Systems Review of Systems Per HPI  Physical Exam Triage Vital Signs ED Triage Vitals  Encounter Vitals Group     BP 06/26/24 1657 114/75     Girls Systolic BP Percentile --      Girls Diastolic BP Percentile --      Boys Systolic BP Percentile --      Boys Diastolic BP Percentile --      Pulse Rate 06/26/24 1657 91     Resp 06/26/24 1657 18     Temp 06/26/24 1657 98.1 F (36.7 C)     Temp Source 06/26/24 1657 Oral     SpO2 06/26/24 1657 96 %     Weight --      Height --       Head Circumference --      Peak Flow --      Pain Score 06/26/24 1658 5     Pain Loc --      Pain Education --      Exclude from Growth Chart --    No data found.  Updated Vital Signs BP 114/75 (BP Location: Right Arm)   Pulse 91   Temp 98.1 F (36.7 C) (Oral)   Resp 18   LMP  (LMP Unknown)   SpO2 96%   Visual Acuity Right Eye Distance:   Left Eye Distance:   Bilateral Distance:    Right Eye Near:   Left Eye Near:    Bilateral Near:     Physical Exam Vitals and nursing note reviewed.  Constitutional:      Appearance: Normal appearance.  HENT:     Head: Atraumatic.     Right Ear: Tympanic membrane and external ear normal.     Left Ear: Tympanic membrane and external ear normal.     Nose: Rhinorrhea present.     Mouth/Throat:     Mouth: Mucous membranes are moist.     Pharynx: Posterior oropharyngeal erythema present.  Eyes:     Extraocular Movements: Extraocular movements intact.     Conjunctiva/sclera: Conjunctivae normal.  Cardiovascular:     Rate and Rhythm: Normal rate and regular rhythm.     Heart sounds: Normal heart sounds.  Pulmonary:     Effort: Pulmonary effort is normal.     Breath sounds: Normal breath sounds. No wheezing or rales.  Musculoskeletal:        General: Normal range of motion.     Cervical back: Normal range of motion and neck supple.  Skin:    General: Skin is warm and dry.  Neurological:     Mental Status: She is alert and oriented to person, place, and time.  Psychiatric:        Mood and Affect: Mood normal.        Thought Content: Thought content normal.      UC Treatments / Results  Labs (all labs ordered are listed, but only abnormal results are displayed) Labs Reviewed - No data to display  EKG   Radiology No results found.  Procedures Procedures (including critical care time)  Medications Ordered in UC Medications - No data to  display  Initial Impression / Assessment and Plan / UC Course  I have  reviewed the triage vital signs and the nursing notes.  Pertinent labs & imaging results that were available during my care of the patient were reviewed by me and considered in my medical decision making (see chart for details).     Vitals and exam overall reassuring, suspect viral respiratory infection causing an asthma and COPD exacerbation.  Will treat with prednisone , Astelin , Phenergan  DM, supportive over-the-counter medications and home care.  Return for worsening or unresolving symptoms.  Final Clinical Impressions(s) / UC Diagnoses   Final diagnoses:  Viral URI with cough  Moderate persistent asthma with acute exacerbation   Discharge Instructions   None    ED Prescriptions     Medication Sig Dispense Auth. Provider   predniSONE  (DELTASONE ) 20 MG tablet Take 2 tablets (40 mg total) by mouth daily with breakfast. 10 tablet Stuart Vernell Norris, PA-C   azelastine  (ASTELIN ) 0.1 % nasal spray Place 1 spray into both nostrils 2 (two) times daily. Use in each nostril as directed 30 mL Stuart Vernell Norris, PA-C   promethazine -dextromethorphan (PROMETHAZINE -DM) 6.25-15 MG/5ML syrup Take 5 mLs by mouth 4 (four) times daily as needed. 100 mL Stuart Vernell Norris, [place]      PDMP not reviewed this encounter.   Stuart Vernell Norris, [place] 06/27/24 1556

## 2024-07-11 ENCOUNTER — Ambulatory Visit (HOSPITAL_COMMUNITY): Admission: RE | Admit: 2024-07-11 | Discharge: 2024-07-11 | Attending: Internal Medicine | Admitting: Internal Medicine

## 2024-07-11 ENCOUNTER — Encounter: Payer: Self-pay | Admitting: Internal Medicine

## 2024-07-11 ENCOUNTER — Ambulatory Visit: Admitting: Internal Medicine

## 2024-07-11 VITALS — BP 115/74 | HR 71 | Ht 61.0 in | Wt 119.0 lb

## 2024-07-11 DIAGNOSIS — J449 Chronic obstructive pulmonary disease, unspecified: Secondary | ICD-10-CM

## 2024-07-11 DIAGNOSIS — J4489 Other specified chronic obstructive pulmonary disease: Secondary | ICD-10-CM

## 2024-07-11 DIAGNOSIS — F17210 Nicotine dependence, cigarettes, uncomplicated: Secondary | ICD-10-CM | POA: Diagnosis not present

## 2024-07-11 DIAGNOSIS — J329 Chronic sinusitis, unspecified: Secondary | ICD-10-CM

## 2024-07-11 LAB — PULMONARY FUNCTION TEST
DL/VA % pred: 84 %
DL/VA: 3.68 ml/min/mmHg/L
DLCO unc % pred: 90 %
DLCO unc: 16.89 ml/min/mmHg
FEF 25-75 Post: 0.93 L/s
FEF 25-75 Pre: 0.48 L/s
FEF2575-%Change-Post: 95 %
FEF2575-%Pred-Post: 37 %
FEF2575-%Pred-Pre: 18 %
FEV1-%Change-Post: 27 %
FEV1-%Pred-Post: 65 %
FEV1-%Pred-Pre: 51 %
FEV1-Post: 1.63 L
FEV1-Pre: 1.28 L
FEV1FVC-%Change-Post: 9 %
FEV1FVC-%Pred-Pre: 60 %
FEV6-%Change-Post: 17 %
FEV6-%Pred-Post: 90 %
FEV6-%Pred-Pre: 77 %
FEV6-Post: 2.77 L
FEV6-Pre: 2.36 L
FEV6FVC-%Change-Post: 1 %
FEV6FVC-%Pred-Post: 92 %
FEV6FVC-%Pred-Pre: 91 %
FVC-%Change-Post: 15 %
FVC-%Pred-Post: 97 %
FVC-%Pred-Pre: 84 %
FVC-Post: 3.07 L
FVC-Pre: 2.65 L
Post FEV1/FVC ratio: 53 %
Post FEV6/FVC ratio: 90 %
Pre FEV1/FVC ratio: 48 %
Pre FEV6/FVC Ratio: 89 %
RV % pred: 201 %
RV: 3.38 L
TLC % pred: 132 %
TLC: 6.12 L

## 2024-07-11 MED ORDER — BREZTRI AEROSPHERE 160-9-4.8 MCG/ACT IN AERO: 2.0000 | INHALATION_SPRAY | Freq: Two times a day (BID) | RESPIRATORY_TRACT | Status: AC

## 2024-07-11 MED ORDER — ALBUTEROL SULFATE (2.5 MG/3ML) 0.083% IN NEBU
2.5000 mg | INHALATION_SOLUTION | Freq: Once | RESPIRATORY_TRACT | Status: AC
  Administered 2024-07-11: 14:00:00 2.5 mg via RESPIRATORY_TRACT

## 2024-07-11 NOTE — Patient Instructions (Addendum)
 Plan A = Automatic = Always=    Breztri  Take 2 puffs first thing in am and then another 2 puffs about 12 hours later.    Work on mining engineer inhaler technique:  relax and gently blow all the way out then take a nice smooth full deep breath back in, triggering the inhaler at same time you start breathing in.  Hold breath in for at least  5 seconds if you can. Blow out breztri   thru nose. Rinse and gargle with water when done.  If mouth or throat bother you at all,  try brushing teeth/gums/tongue with arm and hammer toothpaste/ make a slurry and gargle and spit out.     Plan B = Backup (to supplement plan A, not to replace it) Use your albuterol  inhaler as a rescue medication to be used if you can't catch your breath by resting or slowing your pace  or doing a relaxed purse lip breathing pattern.  - The less you use it, the better it will work when you need it. - Ok to use the inhaler up to 2 puffs  every 4 hours if you must but call for appointment if use goes up over your usual need - Don't leave home without it !!  (think of it like the spare tire or starter fluid for your car)   The key is to stop smoking completely before smoking completely stops you!   Please schedule a follow up visit in 6 months but call sooner if needed  with your inhalers

## 2024-07-11 NOTE — Assessment & Plan Note (Signed)
 Active smoker with recurrent sinusitis/ bronchitis since 20s / much worse since covid  - 04/17/2024  After extensive coaching inhaler device,  effectiveness =    80% so continue symbicort  80 2bid / mucinex  dm and short course otc gerd rx and f/u in 6 weeks  - 05/30/2024 better on symbiocrt worse off it with PW on exam so chage to protonix / pepcid / pred x6 d and return in 6 weeks with pft  - LDSCT   05/29/24  Mild centrilobular emphysema  - PFT's  07/11/2024   FEV1 1.63 (65 % ) ratio 0.53  p 27 % improvement from saba p symbicort   prior to study with DLCO  14.4 (90%)   and FV curve classic concavity

## 2024-07-11 NOTE — Progress Notes (Unsigned)
 Andrea Daugherty, female    DOB: Aug 27, 1970    MRN: [id number]   Brief patient profile:  53  yowf active smoker with pattern of recurrent rhintis/ bronchitis age 53 better p quit working in designer, fashion/clothing age 53  referred to pulmonary clinic in [place] D[place]  04/17/2024 by Andrea Daugherty  for refractory chronic cough with MSCP since fall 2020 some better with prednisone  the best but relapses w/in a week last dose was around Sept 11 25 and this time prednisone  did not correct the problem     History of Present Illness  04/17/2024  Pulmonary/ 1st office eval/ Bassem Bernasconi / [facility]  Chief Complaint  Patient presents with   Consult    Post COVID, recurrent inflammation, wheezing  Dyspnea:  walking around [place] x 15 min some [place]  Cough: after stirring each am w/in 5 min cough  mostly thin mucoid  x few coughs and also at his x 5 min then settles down  Sleep: bed is flat / 2 pillows  SABA use: none now  02: none  LDSCT: ordered  Patient Instructions  At onset of cough > mucinex  DM 1200mg  as needed and add Try prilosec otc 20mg   Take 30-60 min before first meal of the day and Pepcid  ac (famotidine ) 20 mg one @  bedtime until cough is completely gone for at least a week without the need for cough suppression GERD diet reviewed, bed blocks rec  For drainage / throat tickle try take CHLORPHENIRAMINE  4 mg    start with just a dose or two an hour before bedtime and see how you tolerate it before trying in daytime.  Please schedule a follow up office visit in 6 weeks, call sooner if needed with all medications /inhalers/ solutions in hand        Sinus and LDSCT 05/29/24 pending   05/30/2024  f/u ov/[place] office/[name] re: AB maint  on ppi daily no did not  bring meds Chief Complaint  Patient presents with   Shortness of Breath    Lcs 11/5 Wheezing   Dyspnea:  walking around farm a lot easier on either symbicort  or breztri   Cough: when supine / mostly dry / dust will also trigger Sleeping: when  lie down and again at 2 am    / flat bed with 3 pillows coughs on eiter side or back  SABA use: every 4-6 h  not needed while on symbicort / does help 3 am  Symbicort  Breyna )  80 Take 2 puffs first thing in am and then another 2 puffs about 12 hours later.  Ok to use sample Breztri  one twice daily  Work on inhaler technique:  Prednisone  10 mg take  4 each am x 2 days,   2 each am x 2 days,  1 each am x 2 days and stop  Pantoprazole  (protonix ) 40 mg   Take  30-60 min before first meal of the day and Pepcid  (famotidine )  20 mg after supper until return to office - this is the best way to tell whether stomach acid is contributing to your problem.   For cough > mucinex  dm 1200 mg every 12 hours as needed  For drainage / throat tickle try take CHLORPHENIRAMINE  4 mg   may cause drowsiness so start with just a dose or two an hour before bedtime and see how you tolerate it before trying in daytime.   Please schedule a follow up office visit in 6 weeks, call sooner if  needed with PFTs    07/11/2024  f/u ov/[place] office/[name] re: GOLD 2 copd  maint on Breyna    Chief Complaint  Patient presents with   Shortness of Breath    Pft @ 2 F/u   Dyspnea:  doing fine with walking Cough: cough is better /some worse in am  Sleeping: flat bed / 2pilow s  resp cc  SABA use: rarely  02: none       No obvious day to day or daytime variability or assoc excess/ purulent sputum or mucus plugs or hemoptysis or cp or chest tightness, subjective wheeze or overt sinus or hb symptoms.    Also denies any obvious fluctuation of symptoms with weather or environmental changes or other aggravating or alleviating factors except as outlined above   No unusual exposure hx or h/o childhood pna/ asthma or knowledge of premature birth.  Current Allergies, Complete Past Medical History, Past Surgical History, Family History, and Social History were reviewed in [employer] record.  ROS  The following  are not active complaints unless bolded Hoarseness, sore throat, dysphagia, dental problems, itching, sneezing,  nasal congestion or discharge of excess mucus or purulent secretions, ear ache,   fever, chills, sweats, unintended wt loss or wt gain, classically pleuritic or exertional cp,  orthopnea pnd or arm/hand swelling  or leg swelling, presyncope, palpitations, abdominal pain, anorexia, nausea, vomiting, diarrhea  or change in bowel habits or change in bladder habits, change in stools or change in urine, dysuria, hematuria,  rash, arthralgias, visual complaints, headache, numbness, weakness or ataxia or problems with walking or coordination,  change in mood or  memory.         Outpatient Medications Prior to Visit  Medication Sig Dispense Refill   albuterol  (VENTOLIN  HFA) 108 (90 Base) MCG/ACT inhaler Inhale 2 puffs into the lungs every 4 (four) hours as needed. 18 g 2   azelastine  (ASTELIN ) 0.1 % nasal spray Place 1 spray into both nostrils 2 (two) times daily. Use in each nostril as directed 30 mL 0   budesonide -formoterol  (SYMBICORT ) 80-4.5 MCG/ACT inhaler Inhale 2 puffs into the lungs 2 (two) times daily. Rinse mouth after each use. 1 each 11   famotidine  (PEPCID ) 20 MG tablet One after supper 30 tablet 11   omeprazole  (PRILOSEC) 40 MG capsule Take 1 capsule (40 mg total) by mouth daily. 30 capsule 3   promethazine -dextromethorphan (PROMETHAZINE -DM) 6.25-15 MG/5ML syrup Take 5 mLs by mouth 4 (four) times daily as needed. 100 mL 0   pantoprazole  (PROTONIX ) 40 MG tablet Take 1 tablet (40 mg total) by mouth daily. Take 30-60 min before first meal of the day (Patient not taking: Reported on 07/11/2024) 30 tablet 2   predniSONE  (DELTASONE ) 20 MG tablet Take 2 tablets (40 mg total) by mouth daily with breakfast. (Patient not taking: Reported on 07/11/2024) 10 tablet 0   No facility-administered medications prior to visit.        Past Medical History:  Diagnosis Date   Lymphocytic colitis        Objective:    wts  07/11/2024       ***   05/30/24 112 lb (50.8 kg)  05/08/24 115 lb (52.2 kg)  04/17/24 118 lb (53.5 kg)    Vital signs reviewed  07/11/2024  - Note at rest 02 sats  ***% on ***   General appearance:    somber amb wf  HEENT : Oropharynx  ***  Nasal turbinates ***   NECK :  without  apparent JVD/ palpable Nodes/TM    LUNGS: no acc muscle use,  Mild barrel  contour chest wall with bilateral  late exp  wheeze and  without cough on insp or exp maneuvers  and mild  Hyperresonant  to  percussion bilaterally     CV:  RRR  no s3 or murmur or increase in P2, and no edema   ABD:  soft and nontender   MS:  Nl gait/ ext warm without deformities Or obvious joint restrictions  calf tenderness, cyanosis or clubbing     SKIN: warm and dry without lesions    NEURO:  alert, approp, nl sensorium with  no motor or cerebellar deficits apparent.          Assessment

## 2024-07-11 NOTE — Assessment & Plan Note (Signed)
 4  min discussion re active cigarette smoking in addition to office E&M  Ask about tobacco use:   ongoing Advise quitting     I reviewed the Fletcher curve with the patient that basically indicates that  if you quit smoking when your best day FEV1 is still well preserved (as is relatively still   the case here)  it is highly unlikely you will progress to severe disease and informed the patient there was  no medication on the market that has proven to alter the curve/ its downward trajectory  or the likelihood of progression of their disease(unlike other chronic medical conditions such as atheroclerosis where we do think we can change the natural hx with risk reducing meds)    Therefore stopping smoking and maintaining abstinence are  the most important aspects of her care, not choice of inhalers or for that matter, Pulmonary doctors.      Assess willingness:  Not committed at this point Assist in quit attempt:  Per PCP when ready Arrange follow up:   Follow up per Primary Care planned

## 2024-07-13 NOTE — Assessment & Plan Note (Addendum)
 Onset age mid 41s  - 04/17/2024 try 1st gen H1 blockers per guidelines  for noct cough  - Sinus CT 11/5//25 nl   Improved rhinitis symptoms on  1st gen H1 blockers per guidelines  and rec  >>>   exhale breztri   thru the nose to get full benefit of ICs thru the entire resp tract   F/u 6 m with all RESP meds in hand using a trust but verify approach to confirm accurate Medication  Reconciliation The principal here is that until we are certain that the  patients are doing what we've asked, it makes no sense to ask them to do more.

## 2024-07-14 ENCOUNTER — Ambulatory Visit: Payer: Self-pay | Admitting: Internal Medicine

## 2024-07-15 ENCOUNTER — Telehealth: Payer: Self-pay

## 2024-07-15 NOTE — Telephone Encounter (Signed)
 PA for breztri  pls

## 2024-07-16 ENCOUNTER — Telehealth: Payer: Self-pay

## 2024-07-16 ENCOUNTER — Other Ambulatory Visit (HOSPITAL_COMMUNITY): Payer: Self-pay

## 2024-07-16 NOTE — Telephone Encounter (Signed)
*  Pulm  Pharmacy Patient Advocate Encounter   Received notification from Pt Calls Messages that prior authorization for Breztri  is required/requested.   Insurance verification completed.   The patient is insured through [facility].   Per test claim: PA required; PA submitted to above mentioned insurance via Latent Key/confirmation #/EOC [id number] Status is pending

## 2024-07-16 NOTE — Telephone Encounter (Signed)
 Your request has been approved Your PA request has been approved. Additional information will be provided in the approval communication. Authorization Expiration12/22/2026

## 2024-08-12 ENCOUNTER — Ambulatory Visit
Admission: EM | Admit: 2024-08-12 | Discharge: 2024-08-12 | Disposition: A | Discharge status: Home / Self Care | Attending: Family Medicine | Admitting: Family Medicine

## 2024-08-12 DIAGNOSIS — J441 Chronic obstructive pulmonary disease with (acute) exacerbation: Secondary | ICD-10-CM | POA: Diagnosis not present

## 2024-08-12 DIAGNOSIS — R040 Epistaxis: Secondary | ICD-10-CM | POA: Diagnosis not present

## 2024-08-12 DIAGNOSIS — K1379 Other lesions of oral mucosa: Secondary | ICD-10-CM

## 2024-08-12 DIAGNOSIS — R1084 Generalized abdominal pain: Secondary | ICD-10-CM | POA: Diagnosis not present

## 2024-08-12 MED ORDER — DEXAMETHASONE SOD PHOSPHATE PF 10 MG/ML IJ SOLN
10.0000 mg | Freq: Once | INTRAMUSCULAR | Status: AC
  Administered 2024-08-12: 10 mg via INTRAMUSCULAR

## 2024-08-12 MED ORDER — VALACYCLOVIR HCL 1 G PO TABS: 1000.0000 mg | ORAL_TABLET | Freq: Two times a day (BID) | ORAL | 0 refills | Status: AC

## 2024-08-12 NOTE — Discharge Instructions (Addendum)
 We have given you a steroid shot today and recommend continued allergy and COPD regimen.  Humidifiers, nasal saline, and other supportive remedies additionally.  I have sent over some antiviral medication to help with your cold sore additionally.

## 2024-08-12 NOTE — ED Triage Notes (Signed)
 Pt reports lightheaded, dizzy, headache, abdominal pain nose bleeds from the right nostril started x 4 days.

## 2024-08-13 ENCOUNTER — Ambulatory Visit: Payer: Self-pay

## 2024-08-13 NOTE — Telephone Encounter (Signed)
 Pt began valtrex  yesterday, reports mild facial flushing today. Pt requesting advice on whether or not to discontinue medication. Pt educated on concerning symptoms to look out for (swelling, shortness of breath, anything worsening) and to call us  back with any other questions.   FYI Only or Action Required?: Action required by provider: clinical question for provider.  Patient was last seen in primary care on 05/08/2024 by Andrea Suzzane POUR, MD.  Called Nurse Triage reporting Chills and redness to face.  Symptoms began today.  Interventions attempted: Nothing.  Symptoms are: stable.  Triage Disposition: Callback by PCP Today, See Physician Within 24 Hours  Patient/caregiver understands and will follow disposition?: Yes  Copied from CRM #[id number]. Topic: Appointments - Appointment Scheduling >> Aug 13, 2024  3:50 PM Tonda B wrote: Patient/patient representative is calling to schedule an appointment. Refer to attachments for appointment information.  Patient is calling saying that she thinks she is having an allergic reaction to her rx valACYclovir  (VALTREX ) 1000 MG tablet Reason for Disposition  Taking new prescription medicine  (Exceptions: Finished taking new prescription antibiotic OR questions about flushing from niacin)  Answer Assessment - Initial Assessment Questions 1. APPEARANCE of RASH: What does the rash look like? (e.g., spots, blisters, raised areas, skin peeling, scaly)     Like a mild sunburn on my face, denies swelling, denies SOB  3. LOCATION: Where is the rash located?     Face  4. COLOR: What color is the rash? (Note: It is difficult to assess rash color in people with darker-colored skin. When this situation occurs, simply ask the caller to describe what they see.)     Red  5. ONSET: When did the rash begin?     Today after starting valtrex  yesterday  10. OTHER SYMPTOMS: Do you have any other symptoms? (e.g., sore throat, fever, joint pain)        Chills last night  Protocols used: Rash - Widespread On Drugs-A-AH

## 2024-08-13 NOTE — ED Provider Notes (Signed)
 " [facility]    CSN: [id number] Arrival date & time: 08/12/24  1451      History   Chief Complaint No chief complaint on file.   HPI Andrea Daugherty is a 54 y.o. female.   Patient presenting today with about 4-day history of congestion, right sided epistaxis intermittently, sinus headache, cough, wheezing.  Denies fevers, chest pain, shortness of breath, vomiting, diarrhea.  Has been using her typical inhaler regimen for COPD with mild temporary benefit.  Otherwise not trying anything over-the-counter for symptoms.    Past Medical History:  Diagnosis Date   Lymphocytic colitis     Patient Active Problem List   Diagnosis Date Noted   Acute bronchitis with COPD (HCC) 05/08/2024   Gastroesophageal reflux disease 05/08/2024   COPD GOLD 2/AB 04/17/2024   Cigarette smoker 04/17/2024   Chronic sinusitis 04/17/2024   Chest pain 03/29/2024   Allergic rhinitis 03/15/2024   COPD (chronic obstructive pulmonary disease) (HCC) 02/14/2024   Colitis 02/14/2024   Nicotine dependence 02/14/2024   Tennis elbow syndrome 07/11/2013   Shoulder pain 08/17/2011   Rotator cuff syndrome of right shoulder 08/17/2011    Past Surgical History:  Procedure Laterality Date   CESAREAN SECTION     DILATION AND CURETTAGE OF UTERUS     NOVASURE ABLATION     TUBAL LIGATION     WISDOM TOOTH EXTRACTION      OB History     Gravida  4   Para  3   Term  3   Preterm      AB  1   Living  3      SAB  1   IAB      Ectopic      Multiple      Live Births               Home Medications    Prior to Admission medications  Medication Sig Start Date End Date Taking? Authorizing Provider  valACYclovir  (VALTREX ) 1000 MG tablet Take 1 tablet (1,000 mg total) by mouth 2 (two) times daily for 7 days. 08/12/24 08/19/24 Yes Stuart Vernell Norris, PA-C  albuterol  (VENTOLIN  HFA) 108 [phone] Base) MCG/ACT inhaler Inhale 2 puffs into the lungs every 4 (four) hours as needed. 05/08/24    Tobie Suzzane POUR, MD  azelastine  (ASTELIN ) 0.1 % nasal spray Place 1 spray into both nostrils 2 (two) times daily. Use in each nostril as directed 06/26/24   Stuart Vernell Norris, PA-C  budesonide -glycopyrrolate-formoterol  (BREZTRI  AEROSPHERE) 160-9-4.8 MCG/ACT AERO inhaler Take 2 puffs first thing in am and then another 2 puffs about 12 hours later. 07/11/24   Darlean Ozell NOVAK, MD  famotidine  (PEPCID ) 20 MG tablet One after supper 05/30/24   Darlean Ozell NOVAK, MD  omeprazole  (PRILOSEC) 40 MG capsule Take 1 capsule (40 mg total) by mouth daily. 05/13/24   Tobie Suzzane POUR, MD  pantoprazole  (PROTONIX ) 40 MG tablet Take 1 tablet (40 mg total) by mouth daily. Take 30-60 min before first meal of the day Patient not taking: Reported on 07/11/2024 05/30/24   Darlean Ozell NOVAK, MD    Family History Family History  Problem Relation Age of Onset   Cancer Mother    Heart failure Father     Social History Social History[1]   Allergies   Wasp venom and Hydrocodone    Review of Systems Review of Systems PER HPI  Physical Exam Triage Vital Signs ED Triage Vitals  Encounter Vitals Group  BP 08/12/24 1548 129/84     Girls Systolic BP Percentile --      Girls Diastolic BP Percentile --      Boys Systolic BP Percentile --      Boys Diastolic BP Percentile --      Pulse Rate 08/12/24 1548 76     Resp 08/12/24 1548 20     Temp 08/12/24 1548 98.7 F (37.1 C)     Temp Source 08/12/24 1548 Oral     SpO2 08/12/24 1548 96 %     Weight --      Height --      Head Circumference --      Peak Flow --      Pain Score 08/12/24 1551 0     Pain Loc --      Pain Education --      Exclude from Growth Chart --    No data found.  Updated Vital Signs BP 129/84 (BP Location: Right Arm)   Pulse 76   Temp 98.7 F (37.1 C) (Oral)   Resp 20   LMP  (LMP Unknown)   SpO2 96%   Visual Acuity Right Eye Distance:   Left Eye Distance:   Bilateral Distance:    Right Eye Near:   Left Eye Near:     Bilateral Near:     Physical Exam Vitals and nursing note reviewed.  Constitutional:      Appearance: Normal appearance.  HENT:     Head: Atraumatic.     Right Ear: Tympanic membrane and external ear normal.     Left Ear: Tympanic membrane and external ear normal.     Nose: Rhinorrhea present.     Mouth/Throat:     Mouth: Mucous membranes are moist.     Pharynx: No posterior oropharyngeal erythema.     Comments: Small gingival sore present to the left upper oral region Eyes:     Extraocular Movements: Extraocular movements intact.     Conjunctiva/sclera: Conjunctivae normal.  Cardiovascular:     Rate and Rhythm: Normal rate and regular rhythm.     Heart sounds: Normal heart sounds.  Pulmonary:     Effort: Pulmonary effort is normal.     Breath sounds: Wheezing present. No rales.  Musculoskeletal:        General: Normal range of motion.     Cervical back: Normal range of motion and neck supple.  Skin:    General: Skin is warm and dry.  Neurological:     Mental Status: She is alert and oriented to person, place, and time.  Psychiatric:        Mood and Affect: Mood normal.        Thought Content: Thought content normal.      UC Treatments / Results  Labs (all labs ordered are listed, but only abnormal results are displayed) Labs Reviewed - No data to display  EKG   Radiology No results found.  Procedures Procedures (including critical care time)  Medications Ordered in UC Medications  dexamethasone  (DECADRON ) injection 10 mg (10 mg Intramuscular Given 08/12/24 1637)    Initial Impression / Assessment and Plan / UC Course  I have reviewed the triage vital signs and the nursing notes.  Pertinent labs & imaging results that were available during my care of the patient were reviewed by me and considered in my medical decision making (see chart for details).     Possibly seasonal allergy exacerbation leading to a COPD exacerbation.  Treat with IM Decadron ,  nasal sprays, saline sinus rinses and other supportive measures.  Continue inhaler regimen for COPD.  She notes on exam a cold sore that has been present for about a week, we will try Valtrex  for this.  Return for worsening or unresolving symptoms.  Final Clinical Impressions(s) / UC Diagnoses   Final diagnoses:  COPD exacerbation (HCC)  Epistaxis  Generalized abdominal pain  Mouth sore     Discharge Instructions      We have given you a steroid shot today and recommend continued allergy and COPD regimen.  Humidifiers, nasal saline, and other supportive remedies additionally.  I have sent over some antiviral medication to help with your cold sore additionally.    ED Prescriptions     Medication Sig Dispense Auth. Provider   valACYclovir  (VALTREX ) 1000 MG tablet Take 1 tablet (1,000 mg total) by mouth 2 (two) times daily for 7 days. 14 tablet Stuart Vernell Norris, [place]      PDMP not reviewed this encounter.    [1]  Social History Tobacco Use   Smoking status: Every Day    Current packs/day: 0.50    Average packs/day: 0.5 packs/day for 28.0 years (14.0 ttl pk-yrs)    Types: Cigarettes   Smokeless tobacco: Never  Vaping Use   Vaping status: Never Used  Substance Use Topics   Alcohol use: No   Drug use: No     Stuart Vernell Norris, PA-C 08/13/24 1717  "

## 2024-08-14 NOTE — Telephone Encounter (Signed)
 Called patient per nurse video visit , scheduled for thursday

## 2024-08-15 ENCOUNTER — Telehealth: Payer: Self-pay | Admitting: Family Medicine

## 2024-08-15 DIAGNOSIS — J069 Acute upper respiratory infection, unspecified: Secondary | ICD-10-CM

## 2024-08-15 MED ORDER — PROMETHAZINE-DM 6.25-15 MG/5ML PO SYRP: 5.0000 mL | ORAL_SOLUTION | Freq: Four times a day (QID) | ORAL | 0 refills | Status: AC | PRN

## 2024-08-15 NOTE — Progress Notes (Signed)
 "  Virtual Visit via Video Note  I connected with Andrea Daugherty on 08/15/24 at  8:20 AM EST by a video enabled telemedicine application and verified that I am speaking with the correct person using two identifiers.  Patient Location: Home Provider Location: Home Office  I discussed the limitations, risks, security, and privacy concerns of performing an evaluation and management service by video and the availability of in person appointments. I also discussed with the patient that there may be a patient responsible charge related to this service. The patient expressed understanding and agreed to proceed.  Subjective: PCP: Glennon Sand, NP (Inactive)  No chief complaint on file.  HPI The patient presents today with complaints of viral sinusitis, reporting persistent cough throughout the night and postnasal drainage. She was previously evaluated on 08/12/2024 for a COPD exacerbation and received an IM Decadron  (dexamethasone ) injection at that time. She was advised to use nasal sprays, saline sinus rinses, and other supportive measures.  The patient denies fever, chills, shortness of breath, vomiting, or diarrhea. She also denies sore throat and reports no recent sick contacts.   ROS: Per HPI Current Medications[1]  Observations/Objective: There were no vitals filed for this visit. Physical Exam Patient is well-developed, well-nourished in no acute distress.  Resting comfortably at home.  Head is normocephalic, atraumatic.  No labored breathing.  Speech is clear and coherent with logical content.  Patient is alert and oriented at baseline.   Assessment and Plan: Upper respiratory tract infection, unspecified type -     Promethazine -DM; Take 5 mLs by mouth 4 (four) times daily as needed.  Dispense: 118 mL; Refill: 0   Viral Illness:  I recommend symptomatic treatments with the following: Start taking Promethazine  DM 5 mL by mouth every 4 hours as needed for cough and cold  symptoms. Increase fluid intake and allow for plenty of rest. Take Tylenol  as needed for pain, fever, or general discomfort. Perform warm saltwater gargles 3-4 times daily to help with throat pain or discomfort. (Mix 1/2 teaspoon of salt in a glass of warm water and gargle several times daily to reduce throat inflammation and soothe irritation.) Ginger tea: Reduces throat irritation and can help with nausea. Look for sugar-free or honey-based throat lozenges to help with a sore throat. Use a humidifier at bedtime to help with cough and nasal congestion. For nasal congestion: The use of heated humidified air is a safe and effective therapy. Saline nasal sprays may also help alleviate nasal symptoms of the common cold. For cough: Treatment with honey may reduce cough frequency and severity. Follow up if your symptoms do not improve after 7 to 10 days of symptom onset.  Follow Up Instructions: No follow-ups on file.   I discussed the assessment and treatment plan with the patient. The patient was provided an opportunity to ask questions, and all were answered. The patient agreed with the plan and demonstrated an understanding of the instructions.   The patient was advised to call back or seek an in-person evaluation if the symptoms worsen or if the condition fails to improve as anticipated.  The above assessment and management plan was discussed with the patient. The patient verbalized understanding of and has agreed to the management plan.   Adger Cantera  Z Bacchus, FNP     [1]  Current Outpatient Medications:    promethazine -dextromethorphan (PROMETHAZINE -DM) 6.25-15 MG/5ML syrup, Take 5 mLs by mouth 4 (four) times daily as needed., Disp: 118 mL, Rfl: 0   albuterol  (VENTOLIN   HFA) 108 (90 Base) MCG/ACT inhaler, Inhale 2 puffs into the lungs every 4 (four) hours as needed., Disp: 18 g, Rfl: 2   azelastine  (ASTELIN ) 0.1 % nasal spray, Place 1 spray into both nostrils 2 (two) times daily. Use in each  nostril as directed, Disp: 30 mL, Rfl: 0   budesonide -glycopyrrolate-formoterol  (BREZTRI  AEROSPHERE) 160-9-4.8 MCG/ACT AERO inhaler, Take 2 puffs first thing in am and then another 2 puffs about 12 hours later., Disp: 10.7 g, Rfl: 11   famotidine  (PEPCID ) 20 MG tablet, One after supper, Disp: 30 tablet, Rfl: 11   omeprazole  (PRILOSEC) 40 MG capsule, Take 1 capsule (40 mg total) by mouth daily., Disp: 30 capsule, Rfl: 3   pantoprazole  (PROTONIX ) 40 MG tablet, Take 1 tablet (40 mg total) by mouth daily. Take 30-60 min before first meal of the day (Patient not taking: Reported on 07/11/2024), Disp: 30 tablet, Rfl: 2   valACYclovir  (VALTREX ) 1000 MG tablet, Take 1 tablet (1,000 mg total) by mouth 2 (two) times daily for 7 days., Disp: 14 tablet, Rfl: 0  "

## 2025-01-22 ENCOUNTER — Ambulatory Visit: Payer: Self-pay | Admitting: Nurse Practitioner
